# Patient Record
Sex: Male | Born: 1965 | Race: White | Hispanic: No | Marital: Single | Smoking: Current every day smoker
Health system: Southern US, Community
[De-identification: ages and names within clinical notes are randomized; demographics above are authoritative.]

---

## 2019-08-01 ENCOUNTER — Inpatient Hospital Stay
Admission: AD | Admit: 2019-08-01 | Discharge: 2019-08-08 | DRG: 885 | Disposition: A | Discharge status: Home / Self Care | Payer: PRIVATE HEALTH INSURANCE | Source: Other Acute Inpatient Hospital | Attending: Psychiatry | Admitting: Psychiatry

## 2019-08-01 DIAGNOSIS — F314 Bipolar disorder, current episode depressed, severe, without psychotic features: Secondary | ICD-10-CM

## 2019-08-01 DIAGNOSIS — U071 COVID-19: Secondary | ICD-10-CM | POA: Diagnosis present

## 2019-08-01 DIAGNOSIS — F3163 Bipolar disorder, current episode mixed, severe, without psychotic features: Principal | ICD-10-CM | POA: Diagnosis present

## 2019-08-01 DIAGNOSIS — F419 Anxiety disorder, unspecified: Secondary | ICD-10-CM | POA: Diagnosis present

## 2019-08-01 DIAGNOSIS — R45851 Suicidal ideations: Secondary | ICD-10-CM | POA: Diagnosis present

## 2019-08-01 DIAGNOSIS — R45850 Homicidal ideations: Secondary | ICD-10-CM | POA: Diagnosis present

## 2019-08-01 DIAGNOSIS — R0789 Other chest pain: Secondary | ICD-10-CM | POA: Diagnosis present

## 2019-08-01 DIAGNOSIS — G4700 Insomnia, unspecified: Secondary | ICD-10-CM | POA: Diagnosis present

## 2019-08-01 DIAGNOSIS — R079 Chest pain, unspecified: Secondary | ICD-10-CM

## 2019-08-01 DIAGNOSIS — F79 Unspecified intellectual disabilities: Secondary | ICD-10-CM | POA: Diagnosis present

## 2019-08-01 DIAGNOSIS — F17210 Nicotine dependence, cigarettes, uncomplicated: Secondary | ICD-10-CM | POA: Diagnosis present

## 2019-08-01 DIAGNOSIS — F329 Major depressive disorder, single episode, unspecified: Secondary | ICD-10-CM | POA: Insufficient documentation

## 2019-08-01 MED ORDER — DIVALPROEX SODIUM 500 MG PO DR TAB
500.0000 mg | DELAYED_RELEASE_TABLET | Freq: Two times a day (BID) | ORAL | Status: DC
  Administered 2019-08-03 – 2019-08-06 (×5): 500 mg via ORAL
  Filled 2019-08-01 (×7): qty 1

## 2019-08-01 MED ORDER — ACETAMINOPHEN 325 MG PO TABS: 650.0000 mg | ORAL_TABLET | Freq: Four times a day (QID) | ORAL | Status: DC | PRN

## 2019-08-01 MED ORDER — MAGNESIUM HYDROXIDE 400 MG/5ML PO SUSP: 30.0000 mL | Freq: Every day | ORAL | Status: DC | PRN

## 2019-08-01 MED ORDER — ZOLPIDEM TARTRATE 5 MG PO TABS: 5.0000 mg | ORAL_TABLET | Freq: Every evening | ORAL | Status: DC | PRN

## 2019-08-01 MED ORDER — NICOTINE 21 MG/24HR TD PT24
21.0000 mg | MEDICATED_PATCH | Freq: Every day | TRANSDERMAL | Status: DC
  Administered 2019-08-05 – 2019-08-08 (×2): 21 mg via TRANSDERMAL
  Filled 2019-08-01 (×5): qty 1

## 2019-08-01 MED ORDER — ALUM & MAG HYDROXIDE-SIMETH 200-200-20 MG/5ML PO SUSP: 30.0000 mL | ORAL | Status: DC | PRN

## 2019-08-01 MED ORDER — QUETIAPINE FUMARATE 200 MG PO TABS
200.0000 mg | ORAL_TABLET | Freq: Every day | ORAL | Status: DC
  Administered 2019-08-02: 200 mg via ORAL
  Filled 2019-08-01: qty 1

## 2019-08-01 MED ORDER — DOXEPIN HCL 25 MG PO CAPS
25.0000 mg | ORAL_CAPSULE | Freq: Every evening | ORAL | Status: DC | PRN
  Filled 2019-08-01: qty 1
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
## 2019-08-02 ENCOUNTER — Other Ambulatory Visit: Payer: Self-pay

## 2019-08-02 MED ORDER — LORAZEPAM 2 MG/ML IJ SOLN
2.0000 mg | INTRAMUSCULAR | Status: DC | PRN
  Administered 2019-08-04 – 2019-08-07 (×3): 2 mg via INTRAMUSCULAR
  Filled 2019-08-02 (×6): qty 1

## 2019-08-02 MED ORDER — ZIPRASIDONE MESYLATE 20 MG IM SOLR
20.0000 mg | Freq: Four times a day (QID) | INTRAMUSCULAR | Status: DC | PRN
  Administered 2019-08-02 – 2019-08-07 (×8): 20 mg via INTRAMUSCULAR
  Filled 2019-08-02 (×9): qty 20

## 2019-08-02 MED ORDER — LORAZEPAM 2 MG PO TABS
2.0000 mg | ORAL_TABLET | ORAL | Status: DC | PRN
  Administered 2019-07-31 – 2019-08-06 (×5): 2 mg via ORAL
  Filled 2019-08-02 (×5): qty 1

## 2019-08-02 NOTE — Plan of Care (Signed)
  Problem: Self-Concept: Goal: Will verbalize positive feelings about self Outcome: Progressing  Patient verbalized positive feelings to staff.

## 2019-08-02 NOTE — Progress Notes (Signed)
Patient ID: Brendan Reyes, male   DOB: 11-23-1965, 53 y.o.   MRN: [id number] Patient seen and chart reviewed.  I will defer H&P in the hopes that I get a better evaluation a little later today.  Twice I have attempted to have a conversation with this gentleman and on both occasions he has stormed out of the room and told me he would refuse to answer any questions.  He seemed to have some difficulty understanding when I explained that he was under involuntary commitment papers and could not simply signed himself out of the hospital.  Nursing concerned about possible need for sedating medication and orders have been placed for as needed Ativan and Geodon.

## 2019-08-02 NOTE — Progress Notes (Signed)
Pt gets easily angered. He slammed down the phone  and punched the windows again. I confronted him and informed him that I  could make a long distance phone call if he needed. He told me to leave him alone. Collier Bullock RN

## 2019-08-02 NOTE — B[facility] Group Notes (Signed)
LCSW Group Therapy Note  08/02/2019 1:00 PM  Type of Therapy/Topic:  Group Therapy:  Emotion Regulation  Participation Level:  Did Not Attend   Description of Group:   The purpose of this group is to assist patients in learning to regulate negative emotions and experience positive emotions. Patients will be guided to discuss ways in which they have been vulnerable to their negative emotions. These vulnerabilities will be juxtaposed with experiences of positive emotions or situations, and patients will be challenged to use positive emotions to combat negative ones. Special emphasis will be placed on coping with negative emotions in conflict situations, and patients will process healthy conflict resolution skills.  Therapeutic Goals: 1. Patient will identify two positive emotions or experiences to reflect on in order to balance out negative emotions 2. Patient will label two or more emotions that they find the most difficult to experience 3. Patient will demonstrate positive conflict resolution skills through discussion and/or role plays  Summary of Patient Progress: X  Therapeutic Modalities:   Cognitive Behavioral Therapy Feelings Identification Dialectical Behavioral Therapy  Brendan Reyes, MSW, LCSW 08/02/2019 11:42 AM 

## 2019-08-02 NOTE — Progress Notes (Signed)
Admission Note:  53 yr male who presents IVC in no acute distress for the treatment of SI and Depression. Patient appears angry he repeatedly stated " l don't want to be here" he was argumentative ad restless initially but after some emotional support he was calm and cooperative with admission process. Ptatient presents with passive SI and contracts for safety upon admission. Patient explained he is angry because his 54 yr old fiancee is cheating on him with his 26 year old nephew.  Patient has Past medical Hx of  Depression, Bipolar disorder and  Anxiety, skin was assessed and found to be clear of any abnormal marks, he was also searched and no contraband found, Plan of care, unit policies, was explained and understanding verbalized. Consents obtained. Food and fluid  was offered and it was accepted, 15 minutes safety checks maintained will continue to monitor closely.

## 2019-08-02 NOTE — Plan of Care (Signed)
Patient newly admitted, hasn't had time to progress.    Problem: Education: Goal: Knowledge of [facility] Education information/materials will improve Outcome: Not Progressing Goal: Emotional status will improve Outcome: Not Progressing Goal: Mental status will improve Outcome: Not Progressing Goal: Verbalization of understanding the information provided will improve Outcome: Not Progressing   Problem: Safety: Goal: Periods of time without injury will increase Outcome: Not Progressing   Problem: Education: Goal: Ability to make informed decisions regarding treatment will improve Outcome: Not Progressing   Problem: Self-Concept: Goal: Ability to disclose and discuss suicidal ideas will improve Outcome: Not Progressing Goal: Will verbalize positive feelings about self Outcome: Not Progressing   

## 2019-08-02 NOTE — Progress Notes (Signed)
CSW attempted to complete PSA for pt, but was unable to due to pt being aggressive. Pt was slamming the phone on and off the receiver, yelling, punching the window, and beating against his room door. Per staff pt was going to get medication to assist with calming him down.   CSW will try another time to complete PSA.   Evalina Field, MSW, LCSW Clinical Social Work 08/02/2019 9:34 AM

## 2019-08-02 NOTE — Progress Notes (Signed)
Pt was extremely agitated, angry and verbally aggressive. He made threats that he was going to punch the windows out. He was given an injection and willingly took it.Collier Bullock RN

## 2019-08-02 NOTE — Progress Notes (Signed)
Pt was making verbal threats. He was loud, making threats to elope. He put socks on both of his hands and punching the window in his room.   He was cooperative when his PRN was given. Collier Bullock RN

## 2019-08-02 NOTE — Tx Team (Signed)
Initial Treatment Plan 08/02/2019 5:31 AM Raliegh Ip NGE:[id number]    PATIENT STRESSORS: Health problems Marital or family conflict   PATIENT STRENGTHS: Capable of independent living Motivation for treatment/growth Religious Affiliation   PATIENT IDENTIFIED PROBLEMS: Suicidal ideation     Depression                  DISCHARGE CRITERIA:  Improved stabilization in mood, thinking, and/or behavior Motivation to continue treatment in a less acute level of care  PRELIMINARY DISCHARGE PLAN: Outpatient therapy  PATIENT/FAMILY INVOLVEMENT: This treatment plan has been presented to and reviewed with the patient, Brendan Reyes,  The patient and family have been given the opportunity to ask questions and make suggestions.  Harl Bowie, RN 08/02/2019, 5:31 AM

## 2019-08-02 NOTE — Progress Notes (Signed)
Recreation Therapy Notes  Date: 08/02/2019  Time: 9:30 am   Location: Craft room   Behavioral response: N/A   Intervention Topic: Stress  Discussion/Intervention: Patient did not attend group.   Clinical Observations/Feedback:  Patient did not attend group.   Deziyah Arvin LRT/CTRS        Dannon Nguyenthi 08/02/2019 11:21 AM

## 2019-08-03 DIAGNOSIS — F314 Bipolar disorder, current episode depressed, severe, without psychotic features: Secondary | ICD-10-CM

## 2019-08-03 MED ORDER — LITHIUM CARBONATE ER 300 MG PO TBCR
300.0000 mg | EXTENDED_RELEASE_TABLET | Freq: Two times a day (BID) | ORAL | Status: DC
  Administered 2019-08-03 – 2019-08-06 (×5): 300 mg via ORAL
  Filled 2019-08-03 (×5): qty 1

## 2019-08-03 NOTE — Plan of Care (Signed)
Patient acted out on day shift and was given medications. Patient remains asleep and safe on the unit  Problem: Education: Goal: Knowledge of [facility] Education information/materials will improve Outcome: Not Progressing Goal: Emotional status will improve Outcome: Not Progressing Goal: Mental status will improve Outcome: Not Progressing Goal: Verbalization of understanding the information provided will improve Outcome: Not Progressing

## 2019-08-03 NOTE — Plan of Care (Signed)
  Problem: Education: Goal: Knowledge of [facility] Education information/materials will improve Outcome: Not Progressing Goal: Emotional status will improve Outcome: Not Progressing Goal: Mental status will improve Outcome: Not Progressing Goal: Verbalization of understanding the information provided will improve Outcome: Not Progressing  D: Patient has slept most of the shift. Refusing all medication. Not voicing any SI, HI or AVH.  A: Continue to monitor for safety R: Safety maintained.
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
## 2019-08-03 NOTE — Progress Notes (Signed)
D: Patient has slept most of the shift. Refusing all medication. Not voicing any SI, HI or AVH.  A: Continue to monitor for safety R: Safety maintained.
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
## 2019-08-03 NOTE — Progress Notes (Signed)
Pt refused his meds at first. I asked him again and he said that he would take his Depakote because "I'm hurting so bad". I asked him if he was referring to the emotional pain and he said "yes". I asked him if he was SI and he said "yes". I asked him if the had a plan and he said "Yes I want to so bad that I'm going to find any way... break a window... to get out of here and do it". He rates depression and anxiety 10/10. He denies AVH and HI. I made all the staff aware on unit.  Collier Bullock RN

## 2019-08-03 NOTE — BHH Counselor (Addendum)
CSW met with patient to complete the PSA.  Patient identified his problem as "I have no intention on living".  Pt identified that his goal is to "how to figure out how to die.  I pray not eating and drinking will do it".  Patient then began to make statements on how he heard that not eating/drinking was a painless way of dying.  CSW asked patient how would he know that he is safe and ready for discharge and patient stated "when I am dead".   Patients affect was flat and pt appeared to be depressed. Pt avoided eye contact.  He made statements that he was beyond help.  Patient declined aftercare and SPE contact, again stating that he had no intention of living or leaving the unit alive. Pt asked CSW to pray for him and thanked her for her kindness and time, as if saying goodbye.  CSW made nurse aware of the patients comments, including his plan to not leave the unit alive, starve himself while here in an effort to commit suicide, active suicidal ideations and statements that he has no intention on living.   Assunta Curtis, MSW, LCSW 08/03/2019 1:52 PM

## 2019-08-03 NOTE — Progress Notes (Signed)
Recreation Therapy Notes  INPATIENT RECREATION THERAPY ASSESSMENT  Patient Details Name: Brendan Reyes MRN: [id number] DOB: July 28, 1966 Today's Date: 08/03/2019       Information Obtained From: Patient  Able to Participate in Assessment/Interview: Yes  Patient Presentation: Responsive  Reason for Admission (Per Patient): Active Symptoms, Suicidal Ideation  Patient Stressors:    Coping Skills:      Leisure Interests (2+):  Nature - Microbiologist of Recreation/Participation:    Futures trader Resources:     [employer]:     Current Use:    If no, Barriers?:    Expressed Interest in [employer] Information:    [employer] of Residence:  Lobbyist  Patient Main Form of Transportation:    Patient Strengths:  N/A  Patient Identified Areas of Improvement:  N/A  Patient Goal for Hospitalization:  N/A  Current SI (including self-harm):     Current HI:     Current AVH:    Staff Intervention Plan: Group Attendance, Collaborate with Interdisciplinary Treatment Team  Consent to Intern Participation: N/A  Brendan Reyes 08/03/2019, 3:45 PM

## 2019-08-03 NOTE — Progress Notes (Signed)
Recreation Therapy Notes   Date: 08/03/2019  Time: 9:30 am   Location: Craft room   Behavioral response: N/A   Intervention Topic: Leisure  Discussion/Intervention: Patient did not attend group.   Clinical Observations/Feedback:  Patient did not attend group.   Jakwan Sally LRT/CTRS        Nery Frappier 08/03/2019 11:10 AM

## 2019-08-03 NOTE — Tx Team (Signed)
Interdisciplinary Treatment and Diagnostic Plan Update  08/03/2019 Time of Session: 900am Brendan Reyes MRN: [id number]  Principal Diagnosis: <principal problem not specified>  Secondary Diagnoses: Active Problems:   MDD (major depressive disorder)   Current Medications:  Current Facility-Administered Medications  Medication Dose Route Frequency Provider Last Rate Last Admin  . acetaminophen (TYLENOL) tablet 650 mg  650 mg Oral Q6H PRN Cristofano, Dorene Ar, MD      . alum & mag hydroxide-simeth (MAALOX/MYLANTA) 200-200-20 MG/5ML suspension 30 mL  30 mL Oral Q4H PRN Cristofano, Paul A, MD      . divalproex (DEPAKOTE) DR tablet 500 mg  500 mg Oral Q12H Cristofano, Dorene Ar, MD   500 mg at 08/03/19 0905  . doxepin (SINEQUAN) capsule 25 mg  25 mg Oral QHS PRN Cristofano, Paul A, MD      . LORazepam (ATIVAN) tablet 2 mg  2 mg Oral Q4H PRN Clapacs, Madie Reno, MD       Or  . LORazepam (ATIVAN) injection 2 mg  2 mg Intramuscular Q4H PRN Clapacs, John T, MD      . magnesium hydroxide (MILK OF MAGNESIA) suspension 30 mL  30 mL Oral Daily PRN Cristofano, Paul A, MD      . nicotine (NICODERM CQ - dosed in mg/24 hours) patch 21 mg  21 mg Transdermal Daily Cristofano, Paul A, MD      . QUEtiapine (SEROQUEL) tablet 200 mg  200 mg Oral QHS Cristofano, Paul A, MD   200 mg at 08/02/19 2153  . ziprasidone (GEODON) injection 20 mg  20 mg Intramuscular Q6H PRN Clapacs, John T, MD   20 mg at 08/02/19 1722  . zolpidem (AMBIEN) tablet 5 mg  5 mg Oral QHS PRN Cristofano, Dorene Ar, MD       PTA Medications: No medications prior to admission.    Patient Stressors: Health problems Marital or family conflict  Patient Strengths: Capable of independent living Motivation for treatment/growth Religious Affiliation  Treatment Modalities: Medication Management, Group therapy, Case management,  1 to 1 session with clinician, Psychoeducation, Recreational therapy.   Physician Treatment Plan for Primary Diagnosis:  <principal problem not specified> Long Term Goal(s):     Short Term Goals:    Medication Management: Evaluate patient's response, side effects, and tolerance of medication regimen.  Therapeutic Interventions: 1 to 1 sessions, Unit Group sessions and Medication administration.  Evaluation of Outcomes: Not Met  Physician Treatment Plan for Secondary Diagnosis: Active Problems:   MDD (major depressive disorder)  Long Term Goal(s):     Short Term Goals:       Medication Management: Evaluate patient's response, side effects, and tolerance of medication regimen.  Therapeutic Interventions: 1 to 1 sessions, Unit Group sessions and Medication administration.  Evaluation of Outcomes: Not Met   RN Treatment Plan for Primary Diagnosis: <principal problem not specified> Long Term Goal(s): Knowledge of disease and therapeutic regimen to maintain health will improve  Short Term Goals: Ability to remain free from injury will improve, Ability to verbalize frustration and anger appropriately will improve, Ability to demonstrate self-control, Ability to participate in decision making will improve, Ability to verbalize feelings will improve, Ability to disclose and discuss suicidal ideas, Ability to identify and develop effective coping behaviors will improve and Compliance with prescribed medications will improve  Medication Management: RN will administer medications as ordered by provider, will assess and evaluate patient's response and provide education to patient for prescribed medication. RN will report any adverse and/or side effects  to prescribing provider.  Therapeutic Interventions: 1 on 1 counseling sessions, Psychoeducation, Medication administration, Evaluate responses to treatment, Monitor vital signs and CBGs as ordered, Perform/monitor CIWA, COWS, AIMS and Fall Risk screenings as ordered, Perform wound care treatments as ordered.  Evaluation of Outcomes: Not Met   LCSW Treatment Plan  for Primary Diagnosis: <principal problem not specified> Long Term Goal(s): Safe transition to appropriate next level of care at discharge, Engage patient in therapeutic group addressing interpersonal concerns.  Short Term Goals: Engage patient in aftercare planning with referrals and resources, Increase ability to appropriately verbalize feelings, Increase emotional regulation and Increase skills for wellness and recovery  Therapeutic Interventions: Assess for all discharge needs, 1 to 1 time with Social worker, Explore available resources and support systems, Assess for adequacy in community support network, Educate family and significant other(s) on suicide prevention, Complete Psychosocial Assessment, Interpersonal group therapy.  Evaluation of Outcomes: Not Met   Progress in Treatment: Attending groups: No. Participating in groups: No. Taking medication as prescribed: No. Toleration medication: Yes. Family/Significant other contact made: No, will contact:  when consent is given Patient understands diagnosis: No. Discussing patient identified problems/goals with staff: No. Medical problems stabilized or resolved: Yes. Denies suicidal/homicidal ideation: No. Pt endorses SI with plan Issues/concerns per patient self-inventory: No. Other: N/A  New problem(s) identified: No, Describe:  none  New Short Term/Long Term Goal(s): Detox, elimination of AVH/symptoms of psychosis, medication management for mood stabilization; elimination of SI thoughts; development of comprehensive mental wellness/sobriety plan.   Patient Goals:  Pt invited to treatment team and refused to attend, no goal obtained.  Discharge Plan or Barriers: SPE pamphlet, Mobile Crisis information, and AA/NA information provided to patient for additional community support and resources. CSW assessing for appropriate referrals.   Reason for Continuation of Hospitalization: Aggression Medication stabilization Suicidal  ideation  Estimated Length of Stay: 5-7 days  Attendees: Patient: Brendan Reyes 08/03/2019 10:14 AM  Physician: Dr Weber Cooks MD 08/03/2019 10:14 AM  Nursing: Polly Cobia RN 08/03/2019 10:14 AM  RN Care Manager: 08/03/2019 10:14 AM  Social Worker: Minette Brine Nasreen Goedecke LCSW 08/03/2019 10:14 AM  Recreational Therapist: Roanna Epley CTRS LRT 08/03/2019 10:14 AM  Other: Assunta Curtis LCSW  08/03/2019 10:14 AM  Other:  08/03/2019 10:14 AM  Other: 08/03/2019 10:14 AM    Scribe for Treatment Team: Mariann Laster Ginnie Marich, LCSW 08/03/2019 10:14 AM

## 2019-08-03 NOTE — Progress Notes (Signed)
Patient was asleep upon arrival to the unit. Patient was given medication prior to shift and has remained asleep on the unit. Patient being monitored Q 15 minutes for safety per unit protocol. Patient remains safe on the unit.

## 2019-08-03 NOTE — [facility] Suicide Risk Assessment (Signed)
[facility] Admission Suicide Risk Assessment   Nursing information obtained from:  Patient Demographic factors:  Male, Caucasian, Low socioeconomic status Current Mental Status:  Suicidal ideation indicated by patient Loss Factors:  Financial problems / change in socioeconomic status Historical Factors:  NA Risk Reduction Factors:  NA  Total Time spent with patient: 1 hour Principal Problem: Severe bipolar I disorder, current or most recent episode depressed (HCC) Diagnosis:  Principal Problem:   Severe bipolar I disorder, current or most recent episode depressed (HCC)  Subjective Data: Patient seen and chart reviewed.  This is a 53 year old man who was transferred to [place] from [facility].  Patient has been here for over a day.  I attempted to evaluate him yesterday but he refused to participate in even the most basic evaluation.  Today the patient was able to answer a few questions although the evaluation is still incomplete.  He tells me today that if he had a gun or a knife he would absolutely kill himself right now.  He is clearly overwhelmed with emotion and feels angry sad and upset.  Some of the things he is talking about seem a little odd is not clear if any of that is delusional none of that is obviously psychotic.  Patient is not intoxicated does not appear to be having withdrawal and does not complain of specific medical problems  Continued Clinical Symptoms:  Alcohol Use Disorder Identification Test Final Score (AUDIT): 0 The "Alcohol Use Disorders Identification Test", Guidelines for Use in Primary Care, Second Edition.  World Science writer [facility]). Score between 0-7:  no or low risk or alcohol related problems. Score between 8-15:  moderate risk of alcohol related problems. Score between 16-19:  high risk of alcohol related problems. Score 20 or above:  warrants further diagnostic evaluation for alcohol dependence and treatment.   CLINICAL FACTORS:   Severe Anxiety and/or  Agitation Bipolar Disorder:   Mixed State Depression:   Anhedonia Hopelessness Impulsivity   Musculoskeletal: Strength & Muscle Tone: within normal limits Gait & Station: normal Patient leans: N/A  Psychiatric Specialty Exam: Physical Exam  Nursing note and vitals reviewed. Constitutional: He appears well-developed and well-nourished.  HENT:  Head: Normocephalic and atraumatic.  Eyes: Pupils are equal, round, and reactive to light. Conjunctivae are normal.  Cardiovascular: Regular rhythm and normal heart sounds.  Respiratory: Effort normal.  GI: Soft.  Musculoskeletal:        General: Normal range of motion.     Cervical back: Normal range of motion.  Neurological: He is alert.  Skin: Skin is warm and dry.  Psychiatric: His mood appears anxious. His affect is angry and labile. His speech is tangential and slurred. He is agitated. He is not aggressive. Thought content is paranoid. Cognition and memory are impaired. He expresses impulsivity and inappropriate judgment. He exhibits a depressed mood. He expresses suicidal ideation. He expresses suicidal plans.    Review of Systems  Constitutional: Negative.   HENT: Negative.   Eyes: Negative.   Respiratory: Negative.   Cardiovascular: Negative.   Gastrointestinal: Negative.   Musculoskeletal: Negative.   Skin: Negative.   Neurological: Negative.   Psychiatric/Behavioral: Positive for agitation, behavioral problems and suicidal ideas. The patient is nervous/anxious.     Blood pressure 123/88, pulse 84, temperature 98 F (36.7 C), temperature source Oral, resp. rate 16, height 6\' 1"  (1.854 m), weight 98 kg, SpO2 100 %.Body mass index is 28.5 kg/m.  General Appearance: Casual  Eye Contact:  Minimal  Speech:  Garbled, Pressured and Slurred  Volume:  Decreased  Mood:  Angry, Anxious and Depressed  Affect:  Congruent  Thought Process:  Disorganized  Orientation:  Negative  Thought Content:  Rumination and Tangential   Suicidal Thoughts:  Yes.  with intent/plan  Homicidal Thoughts:  No  Memory:  Immediate;   Fair Recent;   Poor Remote;   Poor  Judgement:  Impaired  Insight:  Shallow  Psychomotor Activity:  Restlessness  Concentration:  Concentration: Poor  Recall:  [place] of Knowledge:  Fair  Language:  Fair  Akathisia:  No  Handed:  Right  AIMS (if indicated):     Assets:  Desire for Improvement Physical Health  ADL's:  Impaired  Cognition:  Impaired,  Mild  Sleep:  Number of Hours: 8.25      COGNITIVE FEATURES THAT CONTRIBUTE TO RISK:  Loss of executive function, Polarized thinking and Thought constriction (tunnel vision)    SUICIDE RISK:   Severe:  Frequent, intense, and enduring suicidal ideation, specific plan, no subjective intent, but some objective markers of intent (i.e., choice of lethal method), the method is accessible, some limited preparatory behavior, evidence of impaired self-control, severe dysphoria/symptomatology, multiple risk factors present, and few if any protective factors, particularly a lack of social support.  PLAN OF CARE: Patient will continue on 15-minute checks.  He is being observed by nursing staff.  We will keep a close eye on whether he has any behaviors to suggest acting out on suicidality.  Patient was not fully cooperative with forming a treatment plan.  I am going to add lithium to his medicine because of the intensity of his suicidal statements.  Patient will be engaged as much as possible in individual and group therapy.  We can reassess need for closer observation in an ongoing way.  Obviously reassessment prior to any discharge planning.  I certify that inpatient services furnished can reasonably be expected to improve the patient's condition.   Alethia Berthold, MD 08/03/2019, 11:16 AM

## 2019-08-03 NOTE — Progress Notes (Signed)
Patient safety check was done in patient's room.and was found to be unsafe. Collier Bullock RN

## 2019-08-03 NOTE — BHH Group Notes (Signed)
[place] Group Notes:  (Nursing/MHT/Case Management/Adjunct)  Date:  08/03/2019  Time:  8:31 PM  Type of Therapy:  Group Therapy  Participation Level:  Did Not Attend    Summary of Progress/Problems:  Brendan Reyes 08/03/2019, 8:31 PM

## 2019-08-03 NOTE — Plan of Care (Signed)
Pt was educated on care plan and verbalizes understanding. Collier Bullock RN Problem: Education: Goal: Freight forwarder Education information/materials will improve Outcome: Not Progressing Goal: Emotional status will improve Outcome: Not Progressing Goal: Mental status will improve Outcome: Not Progressing Goal: Verbalization of understanding the information provided will improve Outcome: Not Progressing   Problem: Safety: Goal: Periods of time without injury will increase Outcome: Not Progressing   Problem: Education: Goal: Ability to make informed decisions regarding treatment will improve Outcome: Not Progressing   Problem: Self-Concept: Goal: Ability to disclose and discuss suicidal ideas will improve Outcome: Not Progressing Goal: Will verbalize positive feelings about self Outcome: Not Progressing

## 2019-08-03 NOTE — Progress Notes (Signed)
Pt has been verbally agressive, making threats, agitation, anxiety and physically punching the windows and walls . Pt has made threats throughout the day that he was going to harm himself and escape from the unit.Will continue to monitor to keep safe. Staff is aware.  Collier Bullock RN

## 2019-08-03 NOTE — BHH Group Notes (Signed)
LCSW Group Therapy Note  08/03/2019 2:02 PM  Type of Therapy/Topic:  Group Therapy:  Balance in Life  Participation Level:  Did Not Attend  Description of Group:    This group will address the concept of balance and how it feels and looks when one is unbalanced. Patients will be encouraged to process areas in their lives that are out of balance and identify reasons for remaining unbalanced. Facilitators will guide patients in utilizing problem-solving interventions to address and correct the stressor making their life unbalanced. Understanding and applying boundaries will be explored and addressed for obtaining and maintaining a balanced life. Patients will be encouraged to explore ways to assertively make their unbalanced needs known to significant others in their lives, using other group members and facilitator for support and feedback.  Therapeutic Goals: 1. Patient will identify two or more emotions or situations they have that consume much of in their lives. 2. Patient will identify signs/triggers that life has become out of balance:  3. Patient will identify two ways to set boundaries in order to achieve balance in their lives:  4. Patient will demonstrate ability to communicate their needs through discussion and/or role plays  Summary of Patient Progress: x     Therapeutic Modalities:   Cognitive Behavioral Therapy Solution-Focused Therapy Assertiveness Training  Evalina Field, MSW, LCSW Clinical Social Work 08/03/2019 2:02 PM

## 2019-08-03 NOTE — BHH Suicide Risk Assessment (Signed)
[place] INPATIENT:  Family/Significant Other Suicide Prevention Education  Suicide Prevention Education:  Patient Refusal for Family/Significant Other Suicide Prevention Education: The patient Brendan Reyes has refused to provide written consent for family/significant other to be provided Family/Significant Other Suicide Prevention Education during admission and/or prior to discharge.  Physician notified.  SPE completed with pt, as pt refused to consent to family contact. SPI pamphlet provided to pt and pt was encouraged to share information with support network, ask questions, and talk about any concerns relating to SPE. Pt denies access to guns/firearms and verbalized understanding of information provided. Mobile Crisis information also provided to pt.   Rozann Lesches 08/03/2019, 2:25 PM

## 2019-08-04 MED ORDER — QUETIAPINE FUMARATE 25 MG PO TABS
50.0000 mg | ORAL_TABLET | Freq: Two times a day (BID) | ORAL | Status: DC
  Administered 2019-08-05 – 2019-08-08 (×4): 50 mg via ORAL
  Filled 2019-08-04 (×5): qty 2

## 2019-08-04 MED ORDER — HYDROXYZINE HCL 50 MG PO TABS
50.0000 mg | ORAL_TABLET | Freq: Three times a day (TID) | ORAL | Status: DC | PRN
  Administered 2019-08-06: 50 mg via ORAL
  Filled 2019-08-04: qty 1

## 2019-08-04 NOTE — Progress Notes (Signed)
Medications held for this patient last night because the patient was asleep from previous medication administration per MD orders. Pt awake and alert at this time. Patient without complaint. Patient remains safe on the unit.

## 2019-08-04 NOTE — Progress Notes (Signed)
Recreation Therapy Notes   Date: 08/04/2019  Time: 9:30 am   Location: Craft room   Behavioral response: N/A   Intervention Topic: Relaxation   Discussion/Intervention: Patient did not attend group.   Clinical Observations/Feedback:  Patient did not attend group.   Elani Delph LRT/CTRS        Cruze Zingaro 08/04/2019 10:47 AM

## 2019-08-04 NOTE — Progress Notes (Signed)
Patient slept for about 2 hours then woke up with anxiety, agitations and aggressive behaviors. Kicking and throwing things, yelling, cursing. Refusing medications and meals " I will not eat until I die.Brendan KitchenMarland KitchenI have no reason to live". Patient becomes erratic over little things and is difficult to redirect. Guarded and not allowing staff to talk to him. Currently in his room. Safety precautions reinforced.

## 2019-08-04 NOTE — [facility] Group Notes (Signed)
LCSW Group Therapy Note  08/04/2019 12:50 PM  Type of Therapy and Topic:  Group Therapy:  Feelings around Relapse and Recovery  Participation Level:  Did Not Attend   Description of Group:    Patients in this group will discuss emotions they experience before and after a relapse. They will process how experiencing these feelings, or avoidance of experiencing them, relates to having a relapse. Facilitator will guide patients to explore emotions they have related to recovery. Patients will be encouraged to process which emotions are more powerful. They will be guided to discuss the emotional reaction significant others in their lives may have to their relapse or recovery. Patients will be assisted in exploring ways to respond to the emotions of others without this contributing to a relapse.  Therapeutic Goals: 1. Patient will identify two or more emotions that lead to a relapse for them 2. Patient will identify two emotions that result when they relapse 3. Patient will identify two emotions related to recovery 4. Patient will demonstrate ability to communicate their needs through discussion and/or role plays   Summary of Patient Progress: X  Therapeutic Modalities:   Cognitive Behavioral Therapy Solution-Focused Therapy Assertiveness Training Relapse Prevention Therapy   Brendan Reyes, MSW, LCSW 08/04/2019 12:50 PM 
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
## 2019-08-04 NOTE — Progress Notes (Signed)
Pt still pacing and yelling in the hallway. Pt agitated. Threw all of his snacks out of the room. Placed bedside commode outside the room as well. Pt still cursing at staff.

## 2019-08-04 NOTE — Progress Notes (Signed)
Pt asleep in his room.

## 2019-08-04 NOTE — Progress Notes (Signed)
Pt refused his morning vital sign check.

## 2019-08-04 NOTE — Plan of Care (Signed)
Patient was in bed upon the beginning of this shift. Was encouraged to go to the dayroom for breakfast. Became angry and agitated when he was told not to take food to his room then decided not to eat. Became more and more restless and irritated, reporting that he wants to kill himself. "That dem doctor is not even talking to [place], he is only talking to you all, not [place] patients". Patient paced in the hallway talking about killing himself. Received Ativan 2mg  by mouth. MD was notified. Emotional support provided. Safety precaution reinforced.

## 2019-08-04 NOTE — Progress Notes (Addendum)
Pt woke up agitated, wanting to make a phone call. Pt c/o not getting his phone call from his friend Arbie Cookey [phone]) who he states called him back earlier today while he was asleep. Pt was banging on the window and raising his voice at staff. Writer and other pt's RN decided to give patient medication for agitation - Ativan 2 mg IM and Geodon 20 mg IM. Before we could administer injections, pt became combative, belligerent and making aggressive moves against staff member. Security was there to handle de-escalation along with this Probation officer and Catalina Antigua, Therapist, sports. Pt was yelling but agreed to take injections. Pt given one IM injection in each deltoid muscle without incident.

## 2019-08-05 LAB — LITHIUM LEVEL: Lithium Lvl: <0.06 mmol/L — ABNORMAL LOW (ref 0.60–1.20)

## 2019-08-05 MED ORDER — HALOPERIDOL LACTATE 5 MG/ML IJ SOLN
5.0000 mg | Freq: Three times a day (TID) | INTRAMUSCULAR | Status: DC | PRN
  Administered 2019-08-05: 5 mg via INTRAMUSCULAR
  Filled 2019-08-05 (×2): qty 1

## 2019-08-05 MED ORDER — QUETIAPINE FUMARATE 200 MG PO TABS: 300.0000 mg | ORAL_TABLET | Freq: Every day | ORAL | Status: DC

## 2019-08-05 MED ORDER — QUETIAPINE FUMARATE 200 MG PO TABS
400.0000 mg | ORAL_TABLET | Freq: Every day | ORAL | Status: DC
  Administered 2019-08-05: 21:00:00 400 mg via ORAL
  Filled 2019-08-05: qty 2

## 2019-08-05 MED ORDER — HALOPERIDOL 5 MG PO TABS: 5.0000 mg | ORAL_TABLET | ORAL | Status: DC

## 2019-08-05 MED ORDER — HALOPERIDOL 5 MG PO TABS: 5.0000 mg | ORAL_TABLET | Freq: Three times a day (TID) | ORAL | Status: DC | PRN

## 2019-08-05 NOTE — Plan of Care (Signed)
Patient unable to process information received  Problem: Education: Goal: Knowledge of [facility] Education information/materials will improve Outcome: Progressing Goal: Emotional status will improve Outcome: Not Progressing Goal: Mental status will improve Outcome: Not Progressing Goal: Verbalization of understanding the information provided will improve Outcome: Not Progressing   Problem: Safety: Goal: Periods of time without injury will increase Outcome: Not Progressing   Problem: Education: Goal: Ability to make informed decisions regarding treatment will improve Outcome: Not Progressing   Problem: Education: Goal: Ability to make informed decisions regarding treatment will improve Outcome: Not Progressing

## 2019-08-05 NOTE — Progress Notes (Signed)
4 -Patient remained  Sleep  In room 1:1 present  5Patient remained  Sleep  In room 1:1 present   6 Patient remained  Sleep  In room 1:1 present  7Patient remained  Sleep  In room 1:1 present
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
## 2019-08-05 NOTE — Progress Notes (Signed)
D: Bipolar   A: Requested  To call relative this am in [place] at 7:20 AM voice mail left. Patient commented on why he didn't get his medication last night . Stated he was witting it in his papers. Requested patient to come to medication room for his am medication . Stated to Probation officer he wasn't taking anything . Writer approached patient again , patient again stated he wasn't taking no medication  And he wasn't going to eat anything else while he is here. Patient paced up hall to his room .   Patient approached  Nursing station  Once again stating that he was not  Taking any medication and not going to eat  And " What every happens , happens" R:  Patient   Monitored every 15 minutes

## 2019-08-05 NOTE — Plan of Care (Signed)
Pt verbally and physically aggressive. Pt admitted that he felt hopeless and frustrated with no social support and feels isolated here on the unit. Still doesn't express feelings appropriately.   Problem: Self-Concept: Goal: Ability to disclose and discuss suicidal ideas will improve Outcome: Progressing   Problem: Education: Goal: Emotional status will improve Outcome: Not Progressing Goal: Mental status will improve Outcome: Not Progressing Goal: Verbalization of understanding the information provided will improve Outcome: Not Progressing   Problem: Education: Goal: Ability to make informed decisions regarding treatment will improve Outcome: Not Progressing

## 2019-08-05 NOTE — Progress Notes (Signed)
   08/04/19 2200  Psych Admission Type (Psych Patients Only)  Admission Status Involuntary  Psychosocial Assessment  Patient Complaints None  Eye Contact Glaring  Facial Expression Angry  Affect Angry;Labile;Irritable  Speech Logical/coherent  Interaction Assertive;Demanding  Motor Activity Restless  Appearance/Hygiene Unremarkable  Behavior Characteristics Agressive verbally;Agitated;Irritable  Mood Anxious;Angry  Aggressive Behavior  Targets Property  Type of Behavior Other (Comment) (throwing snacks on the floor in the hall)  Effect No apparent injury  Thought Process  Coherency WDL  Content Blaming others  Delusions None reported or observed  Perception WDL  Hallucination None reported or observed  Judgment Poor  Confusion None  Danger to Self  Current suicidal ideation? Denies  Self-Injurious Behavior No self-injurious ideation or behavior indicators observed or expressed   Danger to Others  Danger to Others Reported or observed  Danger to Others Abnormal  Harmful Behavior to others Threats of violence towards other people observed or expressed   Destructive Behavior Threats of violence towards property observed or expressed   Description of Harmful Behavior cursing at staff  Description of Destructive Behavior throwing snacks out room door   Pt observed to have labile moods r/t lack of communication with someone from his list of contacts. Staff reported calling this person all day and this writer also called number twice with no reply or call back. Pt increasingly agitated and preoccupied. Pt verbally aggressive towards staff when asked to calm down. Pt given IM injections of Geodon 20 mg and Ativan 2 mg. Pt still verbally and physically aggressive even after injections until medications began to take effect.

## 2019-08-05 NOTE — Progress Notes (Signed)
Received notification from the lab that patient is COVID positive. Dr. Mallie Darting notified.

## 2019-08-05 NOTE — Progress Notes (Signed)
Brendan Reyes Subjective: Patient is a 53 year old male with a past psychiatric history significant for bipolar disorder who was admitted on 08/03/2019 secondary to change of behavior as well as suicidal ideation.  Objective: Patient is seen in follow-up.  Patient is a 53 year old male with the above-stated past psychiatric history who is seen in follow-up.  He seems very apprehensive today.  Review of his notes from 12/11 was basically retelling the story about issues with a woman he was living with, and the fact that he "wants to die".  He denied suicidal ideation.  He stated that he did not want to kill himself, but that he was unable to cope with the day-to-day struggles of life and "wanted to be dead".  He seems a bit irritable this morning.  He remains somewhat agitated and frustrated.  Currently had had a fairly recent hospitalization in [place], but he is not signed the authorization for care everywhere, so I am unable to obtain those records.  He reportedly had been on Seroquel and Depakote in the past.  His current medications include Depakote, doxepin, lithium, Seroquel and Ambien.  His vital signs are stable, he is afebrile.  He slept 7.5 hours last night.  The only laboratories in our system are his lithium level which was at less than 0.06 on 12/12.  Principal Problem: Severe bipolar I disorder, current or most recent episode depressed (HCC) Diagnosis: Principal Problem:   Severe bipolar I disorder, current or most recent episode depressed (HCC)  Total Time spent with patient: 30 minutes  Past Psychiatric History: See admission H&P  Past Medical History: History reviewed. No pertinent past medical history. History reviewed. No pertinent surgical history. Family History: History reviewed. No pertinent family history. Family Psychiatric  History: See admission H&P Social History:  Social History   Substance and Sexual  Activity  Alcohol Use None     Social History   Substance and Sexual Activity  Drug Use Not on file    Social History   Socioeconomic History  . Marital status: Single    Spouse name: Not on file  . Number of children: Not on file  . Years of education: Not on file  . Highest education level: Not on file  Occupational History  . Not on file  Tobacco Use  . Smoking status: Current Every Day Smoker    Packs/day: 1.00    Types: Cigarettes  . Smokeless tobacco: Never Used  Substance and Sexual Activity  . Alcohol use: Not on file  . Drug use: Not on file  . Sexual activity: Not on file  Other Topics Concern  . Not on file  Social History Narrative  . Not on file   Social Determinants of Health   Financial Resource Strain:   . Difficulty of Paying Living Expenses: Not on file  Food Insecurity:   . Worried About Programme researcher, broadcasting/film/video in the Last Year: Not on file  . Ran Out of Food in the Last Year: Not on file  Transportation Needs:   . Lack of Transportation (Medical): Not on file  . Lack of Transportation (Non-Medical): Not on file  Physical Activity:   . Days of Exercise per Week: Not on file  . Minutes of Exercise per Session: Not on file  Stress:   . Feeling of Stress : Not on file  Social Connections:   . Frequency of Communication with Friends and Family: Not  on file  . Frequency of Social Gatherings with Friends and Family: Not on file  . Attends Religious Services: Not on file  . Active Member of Clubs or Organizations: Not on file  . Attends Archivist Meetings: Not on file  . Marital Status: Not on file   Additional Social History:                         Sleep: Fair  Appetite:  Good  Current Medications: Current Facility-Administered Medications  Medication Dose Route Frequency Provider Last Rate Last Admin  . acetaminophen (TYLENOL) tablet 650 mg  650 mg Oral Q6H PRN Cristofano, Dorene Ar, MD      . alum & mag hydroxide-simeth  (MAALOX/MYLANTA) 200-200-20 MG/5ML suspension 30 mL  30 mL Oral Q4H PRN Cristofano, Paul A, MD      . divalproex (DEPAKOTE) DR tablet 500 mg  500 mg Oral Q12H Cristofano, Dorene Ar, MD   500 mg at 08/04/19 0804  . doxepin (SINEQUAN) capsule 25 mg  25 mg Oral QHS PRN Cristofano, Dorene Ar, MD      . hydrOXYzine (ATARAX/VISTARIL) tablet 50 mg  50 mg Oral TID PRN Money, Lowry Ram, FNP      . lithium carbonate (LITHOBID) CR tablet 300 mg  300 mg Oral Q12H Clapacs, Madie Reno, MD   300 mg at 08/04/19 0804  . LORazepam (ATIVAN) tablet 2 mg  2 mg Oral Q4H PRN Clapacs, Madie Reno, MD   2 mg at 08/05/19 2025   Or  . LORazepam (ATIVAN) injection 2 mg  2 mg Intramuscular Q4H PRN Clapacs, Madie Reno, MD   2 mg at 08/04/19 2042  . magnesium hydroxide (MILK OF MAGNESIA) suspension 30 mL  30 mL Oral Daily PRN Cristofano, Paul A, MD      . nicotine (NICODERM CQ - dosed in mg/24 hours) patch 21 mg  21 mg Transdermal Daily Cristofano, Paul A, MD      . QUEtiapine (SEROQUEL) tablet 200 mg  200 mg Oral QHS Cristofano, Paul A, MD   200 mg at 08/02/19 2153  . QUEtiapine (SEROQUEL) tablet 50 mg  50 mg Oral BID Money, Lowry Ram, FNP      . ziprasidone (GEODON) injection 20 mg  20 mg Intramuscular Q6H PRN Clapacs, Madie Reno, MD   20 mg at 08/04/19 2042  . zolpidem (AMBIEN) tablet 5 mg  5 mg Oral QHS PRN Cristofano, Dorene Ar, MD        Lab Results:  Results for orders placed or performed during the hospital encounter of 08/01/19 (from the past 48 hour(s))  Lithium level     Status: Abnormal   Collection Time: 08/05/19  6:58 AM  Result Value Ref Range   Lithium Lvl <0.06 (L) 0.60 - 1.20 mmol/L    Comment: Performed at [facility], [address], [place], [place] [address]    Blood Alcohol level:  No results found for: [facility]  Metabolic Disorder Labs: No results found for: HGBA1C, MPG No results found for: PROLACTIN No results found for: CHOL, TRIG, HDL, CHOLHDL, VLDL, LDLCALC  Physical Findings: AIMS:  , ,  ,  ,    CIWA:     COWS:     Musculoskeletal: Strength & Muscle Tone: within normal limits Gait & Station: normal Patient leans: N/A  Psychiatric Specialty Exam: Physical Exam  Nursing note and vitals reviewed. Constitutional: He is oriented to person, place, and time. He appears  well-developed and well-nourished.  HENT:  Head: Normocephalic and atraumatic.  Respiratory: Effort normal.  Neurological: He is alert and oriented to person, place, and time.    Review of Systems  Blood pressure 135/80, pulse 94, temperature 98.4 F (36.9 C), temperature source Oral, resp. rate 18, height 6\' 1"  (1.854 m), weight 98 kg, SpO2 100 %.Body mass index is 28.5 kg/m.  General Appearance: Casual  Eye Contact:  Fair  Speech:  Normal Rate  Volume:  Increased  Mood:  Anxious, Dysphoric and Irritable  Affect:  Labile  Thought Process:  Coherent and Descriptions of Associations: Circumstantial  Orientation:  Full (Time, Place, and Person)  Thought Content:  Rumination  Suicidal Thoughts:  No  Homicidal Thoughts:  No  Memory:  Immediate;   Fair Recent;   Fair Remote;   Fair  Judgement:  Impaired  Insight:  Lacking  Psychomotor Activity:  Increased  Concentration:  Concentration: Fair and Attention Span: Fair  Recall:  [employer]Fair  Fund of Knowledge:  Fair  Language:  Good  Akathisia:  Negative  Handed:  Right  AIMS (if indicated):     Assets:  Desire for Improvement Resilience  ADL's:  Intact  Cognition:  WNL  Sleep:  Number of Hours: 7.5     Treatment Plan Summary: Daily contact with patient to assess and evaluate symptoms and progress in treatment, Medication management and Plan : Patient is seen and examined.  Patient is a 53 year old male with the above-stated past psychiatric history who is seen in follow-up.   Diagnosis: #1 bipolar disorder, most recently mixed, severe  Patient is seen in follow-up.  He appears to be still significantly irritable and depressed.  He has recently been started on  Depakote with his lithium as well as the Seroquel.  This is really only been for 1 day.  His lithium level on admission was essentially negative.  No changes medications today, and hopefully as the kick in his mood will improve. 1.  Continue Depakote DR 500 mg p.o. every 12 hours for mood stability. 2.  Continue doxepin 25 mg p.o. nightly as needed insomnia. 3.  Continue hydroxyzine 50 mg p.o. 3 times daily as needed anxiety. 4.  Continue lithium carbonate CR 300 mg p.o. every 12 hours for mood stability. 5.  Continue Seroquel 50 mg p.o. twice daily and 200 mg p.o. nightly for mood stability. 6.  Continue Geodon 20 mg IM every 12 hours as needed agitation. 7.  Continue Ambien 5 mg p.o. nightly as needed insomnia. 8.  Disposition planning-progress.  Antonieta PertGreg Lawson Brean Carberry, MD 08/05/2019, 10:56 AM

## 2019-08-05 NOTE — [facility] Group Notes (Signed)
Type of Therapy and Topic:  Group Therapy:  Trust and Honesty 08/05/2019 1:30pm Participation Level:  Did Not Attend   Description of Group:     In this group patients will be asked to explore the value of being honest.  Patients will be guided to discuss their thoughts, feelings, and behaviors related to honesty and trusting in others. Patients will process together how trust and honesty relate to forming relationships with peers, family members, and self. Each patient will be challenged to identify and express feelings of being vulnerable. Patients will discuss reasons why people are dishonest and identify alternative outcomes if one was truthful (to self or others). This group will be process-oriented, with patients participating in exploration of their own experiences, giving and receiving support, and processing challenge from other group members.    Therapeutic Goals:  1.  Patient will identify why honesty is important to relationships and how honesty overall affects relationships.  2.  Patient will identify a situation where they lied or were lied too and the  feelings, thought process, and behaviors surrounding the situation  3.  Patient will identify the meaning of being vulnerable, how that feels, and how that correlates to being honest with self and others.  4.  Patient will identify situations where they could have told the truth, but instead lied and explain reasons of dishonesty.    Summary of Patient Progress:  Announcement made via overhead page, patient did not attend.   Therapeutic Modalities:   Cognitive Behavioral Therapy Solution Focused Therapy Motivational Interviewing Brief Therapy 

## 2019-08-06 ENCOUNTER — Inpatient Hospital Stay: Payer: PRIVATE HEALTH INSURANCE

## 2019-08-06 ENCOUNTER — Other Ambulatory Visit: Payer: Self-pay

## 2019-08-06 LAB — CBC WITH DIFFERENTIAL/PLATELET
Abs Immature Granulocytes: 0.01 10*3/uL (ref 0.00–0.07)
Basophils Absolute: 0 10*3/uL (ref 0.0–0.1)
Basophils Relative: 1 %
Eosinophils Absolute: 0.2 10*3/uL (ref 0.0–0.5)
Eosinophils Relative: 3 %
HCT: 49.5 % (ref 39.0–52.0)
Hemoglobin: 16.8 g/dL (ref 13.0–17.0)
Immature Granulocytes: 0 %
Lymphocytes Relative: 35 %
Lymphs Abs: 2.2 10*3/uL (ref 0.7–4.0)
MCH: 31.1 pg (ref 26.0–34.0)
MCHC: 33.9 g/dL (ref 30.0–36.0)
MCV: 91.5 fL (ref 80.0–100.0)
Monocytes Absolute: 0.6 10*3/uL (ref 0.1–1.0)
Monocytes Relative: 9 %
Neutro Abs: 3.3 10*3/uL (ref 1.7–7.7)
Neutrophils Relative %: 52 %
Platelets: 293 10*3/uL (ref 150–400)
RBC: 5.41 MIL/uL (ref 4.22–5.81)
RDW: 12.3 % (ref 11.5–15.5)
WBC: 6.2 10*3/uL (ref 4.0–10.5)
nRBC: 0 % (ref 0.0–0.2)

## 2019-08-06 LAB — PROTIME-INR
INR: 1 (ref 0.8–1.2)
Prothrombin Time: 12.6 seconds (ref 11.4–15.2)

## 2019-08-06 LAB — RESPIRATORY PANEL BY RT PCR (FLU A&B, COVID)
Influenza A by PCR: NEGATIVE
Influenza B by PCR: NEGATIVE
SARS Coronavirus 2 by RT PCR: POSITIVE — AB

## 2019-08-06 LAB — TROPONIN I (HIGH SENSITIVITY)
Troponin I (High Sensitivity): 3 ng/L (ref <18)
Troponin I (High Sensitivity): 3 ng/L (ref <18)

## 2019-08-06 LAB — PROCALCITONIN: Procalcitonin: <0.1 ng/mL

## 2019-08-06 LAB — BASIC METABOLIC PANEL WITH GFR
Anion gap: 9 (ref 5–15)
BUN: 18 mg/dL (ref 6–20)
CO2: 27 mmol/L (ref 22–32)
Calcium: 9 mg/dL (ref 8.9–10.3)
Chloride: 105 mmol/L (ref 98–111)
Creatinine, Ser: 1.19 mg/dL (ref 0.61–1.24)
GFR calc Af Amer: >60 mL/min (ref >60)
GFR calc non Af Amer: >60 mL/min (ref >60)
Glucose, Bld: 90 mg/dL (ref 70–99)
Potassium: 3.7 mmol/L (ref 3.5–5.1)
Sodium: 141 mmol/L (ref 135–145)

## 2019-08-06 LAB — ABO/RH: ABO/RH(D): A POS

## 2019-08-06 LAB — FIBRIN DERIVATIVES D-DIMER (ARMC ONLY): Fibrin derivatives D-dimer (ARMC): 325.12 ng/mL (FEU) (ref 0.00–499.00)

## 2019-08-06 LAB — FIBRINOGEN: Fibrinogen: 337 mg/dL (ref 210–475)

## 2019-08-06 MED ORDER — ALUM & MAG HYDROXIDE-SIMETH 200-200-20 MG/5ML PO SUSP
30.0000 mL | Freq: Once | ORAL | Status: AC
  Administered 2019-08-06: 30 mL via ORAL
  Filled 2019-08-06: qty 30

## 2019-08-06 MED ORDER — HALOPERIDOL 5 MG PO TABS: 10.0000 mg | ORAL_TABLET | Freq: Three times a day (TID) | ORAL | Status: DC | PRN

## 2019-08-06 MED ORDER — ZINC SULFATE 220 (50 ZN) MG PO CAPS
220.0000 mg | ORAL_CAPSULE | Freq: Every day | ORAL | Status: DC
  Administered 2019-08-08: 220 mg via ORAL
  Filled 2019-08-06 (×3): qty 1

## 2019-08-06 MED ORDER — QUETIAPINE FUMARATE 200 MG PO TABS
600.0000 mg | ORAL_TABLET | Freq: Every day | ORAL | Status: DC
  Administered 2019-08-06: 600 mg via ORAL
  Filled 2019-08-06: qty 3

## 2019-08-06 MED ORDER — NITROGLYCERIN 0.4 MG SL SUBL
0.4000 mg | SUBLINGUAL_TABLET | SUBLINGUAL | Status: DC | PRN
  Administered 2019-08-06 (×2): 0.4 mg via SUBLINGUAL
  Filled 2019-08-06 (×3): qty 1

## 2019-08-06 MED ORDER — HYOSCYAMINE SULFATE 0.125 MG SL SUBL
0.2500 mg | SUBLINGUAL_TABLET | Freq: Once | SUBLINGUAL | Status: AC
  Administered 2019-08-06: 0.25 mg via SUBLINGUAL
  Filled 2019-08-06: qty 2

## 2019-08-06 MED ORDER — ZIPRASIDONE MESYLATE 20 MG IM SOLR
20.0000 mg | INTRAMUSCULAR | Status: DC | PRN
  Filled 2019-08-06: qty 20

## 2019-08-06 MED ORDER — LITHIUM CARBONATE ER 450 MG PO TBCR
450.0000 mg | EXTENDED_RELEASE_TABLET | Freq: Two times a day (BID) | ORAL | Status: DC
  Administered 2019-08-06 – 2019-08-08 (×2): 450 mg via ORAL
  Filled 2019-08-06 (×3): qty 1

## 2019-08-06 MED ORDER — LORAZEPAM 1 MG PO TABS
1.0000 mg | ORAL_TABLET | ORAL | Status: AC | PRN
  Administered 2019-08-08: 1 mg via ORAL

## 2019-08-06 MED ORDER — VITAMIN D 25 MCG (1000 UNIT) PO TABS
1000.0000 [IU] | ORAL_TABLET | Freq: Every day | ORAL | Status: DC
  Administered 2019-08-08: 1000 [IU] via ORAL
  Filled 2019-08-06 (×3): qty 1

## 2019-08-06 MED ORDER — DIVALPROEX SODIUM 500 MG PO DR TAB
500.0000 mg | DELAYED_RELEASE_TABLET | Freq: Every day | ORAL | Status: DC
  Administered 2019-08-06 – 2019-08-08 (×2): 500 mg via ORAL
  Filled 2019-08-06 (×3): qty 1

## 2019-08-06 MED ORDER — OLANZAPINE 5 MG PO TBDP: 10.0000 mg | ORAL_TABLET | Freq: Three times a day (TID) | ORAL | Status: DC | PRN

## 2019-08-06 MED ORDER — DIVALPROEX SODIUM 500 MG PO DR TAB
750.0000 mg | DELAYED_RELEASE_TABLET | Freq: Every day | ORAL | Status: DC
  Administered 2019-08-06: 750 mg via ORAL
  Filled 2019-08-06: qty 1

## 2019-08-06 MED ORDER — VITAMIN C 500 MG PO TABS
500.0000 mg | ORAL_TABLET | Freq: Every day | ORAL | Status: DC
  Administered 2019-08-08: 08:00:00 500 mg via ORAL
  Filled 2019-08-06 (×3): qty 1

## 2019-08-06 MED ORDER — HALOPERIDOL LACTATE 5 MG/ML IJ SOLN
10.0000 mg | Freq: Three times a day (TID) | INTRAMUSCULAR | Status: DC | PRN
  Administered 2019-08-07: 10 mg via INTRAMUSCULAR
  Filled 2019-08-06: qty 2

## 2019-08-06 NOTE — Consult Note (Addendum)
[facility] at [facility]   PATIENT NAME: Brendan Reyes    MR#:  [id number]  DATE OF BIRTH:  [birth year]  DATE OF ADMISSION:  08/01/2019  PRIMARY CARE PHYSICIAN: Patient, No Pcp Per   REQUESTING/REFERRING PHYSICIAN: Clapacs, Brendan Denmark  MD  CHIEF COMPLAINT:   Chest pain  HISTORY OF PRESENT ILLNESS:   Brendan Reyes is a 53 year old male with a known history of bipolar disorder with hypomanic and psychotic symptoms, depression and anxiety who is seen in consultation with his psychiatrics Dr. Toni Reyes for chief complaints of chest pain.  Patient states he was walking around the room when he suddenly felt lightheaded and unsteady on his feet. Following that symptoms he developed left sided chest pain associated with nausea without vomiting.  He describes pain as " electrifying" shocking sensation that was radiating to his left arm with some numbness and tingling in his left fingertips.  Denies other associated symptoms of palpitation, syncope, shortness of breath, or diaphoresis.  Patient reports that his father had a massive MI in his 53s.  Patient is afebrile with blood pressure 135/80 mm Hg,  pulse rate 84 beats/min, sats 100% on RA. There were no focal neurological deficits; he was alert and oriented x4, and he did not demonstrate any memory deficits.   PAST MEDICAL HISTORY:  History reviewed. No pertinent past medical history.  PAST SURGICAL HISTOIRY:  History reviewed. No pertinent surgical history.  SOCIAL HISTORY:   Social History   Tobacco Use  . Smoking status: Current Every Day Smoker    Packs/day: 1.00    Types: Cigarettes  . Smokeless tobacco: Never Used  Substance Use Topics  . Alcohol use: Not on file    FAMILY HISTORY:  History reviewed. No pertinent family history.  DRUG ALLERGIES:  Not on File  REVIEW OF SYSTEMS:  Review of Systems  Constitutional: Negative for chills, fever, malaise/fatigue and weight loss.  HENT: Negative for congestion,  hearing loss and sore throat.   Eyes: Negative for blurred vision and double vision.  Respiratory: Negative for cough, shortness of breath and wheezing.   Cardiovascular: Positive for chest pain. Negative for palpitations, orthopnea and leg swelling.  Gastrointestinal: Positive for nausea. Negative for abdominal pain, diarrhea and vomiting.  Genitourinary: Negative for dysuria and urgency.  Musculoskeletal: Negative for myalgias.  Skin: Negative for rash.  Neurological: Positive for dizziness, tingling and sensory change. Negative for speech change, focal weakness and headaches.  Psychiatric/Behavioral: Positive for depression and suicidal ideas. The patient is nervous/anxious.    MEDICATIONS AT HOME:   Prior to Admission medications   Not on File    VITAL SIGNS:  Blood pressure 135/80, pulse 94, temperature 98.4 F (36.9 C), temperature source Oral, resp. rate 18, height 6\' 1"  (1.854 m), weight 98 kg, SpO2 100 %.  PHYSICAL EXAMINATION:   GENERAL:  53 y.o.-year-old patient lying in the bed with no acute distress.  EYES: Pupils equal, round, reactive to light and accommodation. No scleral icterus. Extraocular muscles intact.  HEENT: Head atraumatic, normocephalic. Oropharynx and nasopharynx clear.  NECK:  Supple, no jugular venous distention. No thyroid enlargement, no tenderness.  LUNGS: Normal breath sounds bilaterally, no wheezing, rales,rhonchi or crepitation. No use of accessory muscles of respiration.  CARDIOVASCULAR: S1, S2 normal. No murmurs, rubs, or gallops.  ABDOMEN: Soft, nontender, nondistended. Bowel sounds present. No organomegaly or mass.  EXTREMITIES: No pedal edema, cyanosis, or clubbing.  NEUROLOGIC: Cranial nerves II through XII are intact. Muscle strength 5/5 in all  extremities. Sensation intact. Gait not checked.  PSYCHIATRIC: The patient is alert and oriented x 3.  SKIN: No obvious rash, lesion, or ulcer.   LABORATORY PANEL:   CBC Recent Labs  Lab  08/06/19 1515  WBC 6.2  HGB 16.8  HCT 49.5  PLT 293   ------------------------------------------------------------------------------------------------------------------  Chemistries  Recent Labs  Lab 08/06/19 2034  NA 141  K 3.7  CL 105  CO2 27  GLUCOSE 90  BUN 18  CREATININE 1.19  CALCIUM 9.0   ------------------------------------------------------------------------------------------------------------------  Cardiac Enzymes No results for input(s): TROPONINI in the last 168 hours. ------------------------------------------------------------------------------------------------------------------  RADIOLOGY:  DG Chest 1 View  Result Date: 08/06/2019 CLINICAL DATA:  Chest pain EXAM: CHEST  1 VIEW COMPARISON:  None. FINDINGS: The heart size and mediastinal contours are within normal limits. Both lungs are clear. The visualized skeletal structures are unremarkable. IMPRESSION: No active disease. Electronically Signed   By: Brendan Reyes M.D.   On: 08/06/2019 20:53   EKG:   Orders placed or performed during the hospital encounter of 08/01/19  . EKG 12-Lead  . EKG 12-Lead    EKG: normal EKG, normal sinus rhythm.  IMPRESSION AND PLAN:  53 year old male with a known history of bipolar disorder with hypomanic and psychotic symptoms, depression and anxiety, COVID 19 illness being evaluated for chest pain per request of his psychiatrics Brendan Reyes  1. Atypical chest pain - Unlikely that this is cardiac related given no dynamic EKG changes with normal cardiac enzymes.  Have low suspicions for PE or ischemic event.  Suspect symptoms maybe related to his underlying anxiety/psychiatric disorder? Patient was reported to be agitated and banging on the door at intervals when informed of his positve Covid status. He apparently is not happy with being isolated. - Negative troponin x 2, continue to trend - Received Maalox for possible acid reflux - ASA 81mg  PO daily  - Check lipid panel   - NTG PRN for chest pain administered but he continues to state that he is "having a heart attack" - Chest xray negative for active cardiopulmonary process - Recommend cardiology consult if he continues to complain of chest pain for further work up including stress test of echocardiogram if warranted. - Continue management of underlying medical condition per psychiatric team.    No further work up at from hospitalist stand point but  If further questions arise, please call or page at that time.  Thank you for allowing [facility]  to participate in the care of this patient.    CODE STATUS: Full code  TOTAL TIME TAKING CARE OF THIS PATIENT: 40 minutes.   Rufina Falco, DNP, CCRN, FNP-C [facility] Nurse Practitioner Between 7pm to 7am - Pager [phone] Actively using [employer] secure chat messaging  After 7am go to [email or web address] - [email or web address]  Office  [phone]   CC: Primary care Physician: Patient, No Pcp Per   Note: This dictation was prepared with Dragon dictation along with smaller phrase technology. Any transcriptional errors that result from this process are unintentional.

## 2019-08-06 NOTE — [facility] Group Notes (Signed)
Group cancelled due to COVID-19.  Keara Pagliarulo  CUEBAS-COLON, LCSW  08/06/2019, 11:30AM 

## 2019-08-06 NOTE — Progress Notes (Signed)
D: Patient has been agitated and banging on the door at intervals. Informed of his Covid positive status and instructed to stay in his room and wear a mask. Patient non-compliant with wearing a mask. Demanding to talk with his family, Patient given Ativan 2 mg IM and Geodon 20 mg IM. Staff assisted patient with making phone call on [employer] phone. Continuing to bang on door at intervals, complaining of being too hot and too cold intermittently.  A: continue to monitor for safety. R: Safety maintained.

## 2019-08-06 NOTE — Progress Notes (Signed)
[facility] MD Progress Note  08/06/2019 9:53 AM Brendan Reyes  MRN:  [id number] Subjective:  Patient is a 53 year old male with a past psychiatric history significant for bipolar disorder who was admitted on 08/03/2019 secondary to change of behavior as well as suicidal ideation.  Objective: Patient is seen in follow-up.  Patient is a 53 year old male with the above-stated past psychiatric history seen in follow-up.  He is more angry and irritable today.  He tested positive for coronavirus and we had to isolate him on 1 waiting of the ward.  He is not happy about that.  He is angry and hostile.  He threw a chair at the door.  He keeps yelling profanities.  We were forced to do an intramuscular injection of Geodon to try and settle him down.  Review of his medications led me to increase his Seroquel, his Depakote and his lithium.  He continues to state he does not understand.  He stated he does not want to kill himself, but he wants to die.  His most recent vital signs were stable, he is afebrile.  He reportedly slept 7.5 hours.  Principal Problem: Severe bipolar I disorder, current or most recent episode depressed (HCC) Diagnosis: Principal Problem:   Severe bipolar I disorder, current or most recent episode depressed (HCC)  Total Time spent with patient: 20 minutes  Past Psychiatric History: See admission H&P  Past Medical History: History reviewed. No pertinent past medical history. History reviewed. No pertinent surgical history. Family History: History reviewed. No pertinent family history. Family Psychiatric  History: See admission H&P Social History:  Social History   Substance and Sexual Activity  Alcohol Use None     Social History   Substance and Sexual Activity  Drug Use Not on file    Social History   Socioeconomic History  . Marital status: Single    Spouse name: Not on file  . Number of children: Not on file  . Years of education: Not on file  . Highest education level: Not  on file  Occupational History  . Not on file  Tobacco Use  . Smoking status: Current Every Day Smoker    Packs/day: 1.00    Types: Cigarettes  . Smokeless tobacco: Never Used  Substance and Sexual Activity  . Alcohol use: Not on file  . Drug use: Not on file  . Sexual activity: Not on file  Other Topics Concern  . Not on file  Social History Narrative  . Not on file   Social Determinants of Health   Financial Resource Strain:   . Difficulty of Paying Living Expenses: Not on file  Food Insecurity:   . Worried About Programme researcher, broadcasting/film/video in the Last Year: Not on file  . Ran Out of Food in the Last Year: Not on file  Transportation Needs:   . Lack of Transportation (Medical): Not on file  . Lack of Transportation (Non-Medical): Not on file  Physical Activity:   . Days of Exercise per Week: Not on file  . Minutes of Exercise per Session: Not on file  Stress:   . Feeling of Stress : Not on file  Social Connections:   . Frequency of Communication with Friends and Family: Not on file  . Frequency of Social Gatherings with Friends and Family: Not on file  . Attends Religious Services: Not on file  . Active Member of Clubs or Organizations: Not on file  . Attends Banker Meetings: Not on file  .  Marital Status: Not on file   Additional Social History:                         Sleep: Good  Appetite:  Good  Current Medications: Current Facility-Administered Medications  Medication Dose Route Frequency Provider Last Rate Last Admin  . acetaminophen (TYLENOL) tablet 650 mg  650 mg Oral Q6H PRN Cristofano, Worthy Rancher, MD      . alum & mag hydroxide-simeth (MAALOX/MYLANTA) 200-200-20 MG/5ML suspension 30 mL  30 mL Oral Q4H PRN Cristofano, Worthy Rancher, MD      . Melene Muller ON 08/07/2019] divalproex (DEPAKOTE) DR tablet 500 mg  500 mg Oral Daily Antonieta Pert, MD      . divalproex (DEPAKOTE) DR tablet 750 mg  750 mg Oral QHS Antonieta Pert, MD      . doxepin  (SINEQUAN) capsule 25 mg  25 mg Oral QHS PRN Cristofano, Worthy Rancher, MD      . haloperidol (HALDOL) tablet 10 mg  10 mg Oral Q8H PRN Antonieta Pert, MD       Or  . haloperidol lactate (HALDOL) injection 10 mg  10 mg Intramuscular Q8H PRN Antonieta Pert, MD      . hydrOXYzine (ATARAX/VISTARIL) tablet 50 mg  50 mg Oral TID PRN Money, Gerlene Burdock, FNP      . lithium carbonate (ESKALITH) CR tablet 450 mg  450 mg Oral Q12H Antonieta Pert, MD      . LORazepam (ATIVAN) tablet 2 mg  2 mg Oral Q4H PRN Clapacs, Jackquline Denmark, MD   2 mg at 08/05/19 8295   Or  . LORazepam (ATIVAN) injection 2 mg  2 mg Intramuscular Q4H PRN Clapacs, Jackquline Denmark, MD   2 mg at 08/05/19 1522  . OLANZapine zydis (ZYPREXA) disintegrating tablet 10 mg  10 mg Oral Q8H PRN Antonieta Pert, MD       And  . LORazepam (ATIVAN) tablet 1 mg  1 mg Oral PRN Antonieta Pert, MD       And  . ziprasidone (GEODON) injection 20 mg  20 mg Intramuscular PRN Antonieta Pert, MD      . magnesium hydroxide (MILK OF MAGNESIA) suspension 30 mL  30 mL Oral Daily PRN Cristofano, Paul A, MD      . nicotine (NICODERM CQ - dosed in mg/24 hours) patch 21 mg  21 mg Transdermal Daily Cristofano, Worthy Rancher, MD   21 mg at 08/05/19 1255  . QUEtiapine (SEROQUEL) tablet 50 mg  50 mg Oral BID Money, Gerlene Burdock, FNP   Stopped at 08/05/19 1634  . QUEtiapine (SEROQUEL) tablet 600 mg  600 mg Oral QHS Antonieta Pert, MD      . ziprasidone (GEODON) injection 20 mg  20 mg Intramuscular Q6H PRN Clapacs, Jackquline Denmark, MD   20 mg at 08/05/19 1253    Lab Results:  Results for orders placed or performed during the hospital encounter of 08/01/19 (from the past 48 hour(s))  Lithium level     Status: Abnormal   Collection Time: 08/05/19  6:58 AM  Result Value Ref Range   Lithium Lvl <0.06 (L) 0.60 - 1.20 mmol/L    Comment: Performed at [facility], [address], [place], [place] [address]  Respiratory Panel by RT PCR (Flu A&B, Covid) -     Status: Abnormal    Collection Time: 08/05/19  7:00 PM  Result Value Ref Range  SARS Coronavirus 2 by RT PCR POSITIVE (A) NEGATIVE    Comment: RESULT CALLED TO, READ BACK BY AND VERIFIED WITH: BARBARA GOODMAN AT 2037 ON 08/05/19 RWW (NOTE) SARS-CoV-2 target nucleic acids are DETECTED. SARS-CoV-2 RNA is generally detectable in upper respiratory specimens  during the acute phase of infection. Positive results are indicative of the presence of the identified virus, but do not rule out bacterial infection or co-infection with other pathogens not detected by the test. Clinical correlation with patient history and other diagnostic information is necessary to determine patient infection status. The expected result is Negative. Fact Sheet for Patients:  https://www.moore.com/https://www.fda.gov/media/142436/download Fact Sheet for Healthcare Providers: https://www.young.biz/https://www.fda.gov/media/142435/download This test is not yet approved or cleared by the [place] FDA and  has been authorized for detection and/or diagnosis of SARS-CoV-2 by FDA under an Emergency Use Authorization (EUA).  This EUA will remain in effect (meaning this test can be use d) for the duration of  the COVID-19 declaration under Section 564(b)(1) of the Act, 21 U.S.C. section 360bbb-3(b)(1), unless the authorization is terminated or revoked sooner.    Influenza A by PCR NEGATIVE NEGATIVE   Influenza B by PCR NEGATIVE NEGATIVE    Comment: (NOTE) The Xpert Xpress SARS-CoV-2/FLU/RSV assay is intended as an aid in  the diagnosis of influenza from Nasopharyngeal swab specimens and  should not be used as a sole basis for treatment. Nasal washings and  aspirates are unacceptable for Xpert Xpress SARS-CoV-2/FLU/RSV  testing. Fact Sheet for Patients: https://www.moore.com/https://www.fda.gov/media/142436/download Fact Sheet for Healthcare Providers: https://www.young.biz/https://www.fda.gov/media/142435/download This test is not yet approved or cleared by the [place] FDA and  has been authorized for detection  and/or diagnosis of SARS-CoV-2 by  FDA under an Emergency Use Authorization (EUA). This EUA will remain  in effect (meaning this test can be used) for the duration of the  Covid-19 declaration under Section 564(b)(1) of the Act, 21  U.S.C. section 360bbb-3(b)(1), unless the authorization is  terminated or revoked. Performed at [facility], [address] [place], [place] [address] CORRECTED ON 12/13 AT [id number]: PREVIOUSLY REPORTED A S NEGATIVE RESULT CALLED TO, READ BACK BY AND VERIFIED WITH: BARBARA GOODMAN AT 2037 ON 08/05/19 RWW     Blood Alcohol level:  No results found for: [facility]  Metabolic Disorder Labs: No results found for: HGBA1C, MPG No results found for: PROLACTIN No results found for: CHOL, TRIG, HDL, CHOLHDL, VLDL, LDLCALC  Physical Findings: AIMS:  , ,  ,  ,    CIWA:    COWS:     Musculoskeletal: Strength & Muscle Tone: within normal limits Gait & Station: normal Patient leans: N/A  Psychiatric Specialty Exam: Physical Exam  Nursing note and vitals reviewed. Constitutional: He is oriented to person, place, and time. He appears well-developed and well-nourished.  HENT:  Head: Normocephalic and atraumatic.  Respiratory: Effort normal.  Neurological: He is alert and oriented to person, place, and time.    Review of Systems  Blood pressure 135/80, pulse 94, temperature 98.4 F (36.9 C), temperature source Oral, resp. rate 18, height 6\' 1"  (1.854 m), weight 98 kg, SpO2 100 %.Body mass index is 28.5 kg/m.  General Appearance: Casual  Eye Contact:  Good  Speech:  Pressured  Volume:  Increased  Mood:  Angry, Dysphoric and Irritable  Affect:  Congruent  Thought Process:  Coherent and Descriptions of Associations: Tangential  Orientation:  Full (Time, Place, and Person)  Thought Content:  Delusions  Suicidal Thoughts:  No  Homicidal Thoughts:  No  Memory:  Immediate;   Poor Recent;   Poor Remote;   Poor  Judgement:  Impaired  Insight:  Lacking   Psychomotor Activity:  Increased  Concentration:  Concentration: Fair and Attention Span: Fair  Recall:  [employer] of Knowledge:  Fair  Language:  Good  Akathisia:  Negative  Handed:  Right  AIMS (if indicated):     Assets:  Desire for Improvement Resilience  ADL's:  Intact  Cognition:  WNL  Sleep:  Number of Hours: 4     Treatment Plan Summary: Daily contact with patient to assess and evaluate symptoms and progress in treatment, Medication management and Plan : Patient is seen and examined.  Patient is a 53 year old male with the above-stated past psychiatric history who is seen in follow-up.  Diagnosis: #1 bipolar disorder, most recently mixed, severe.  Patient is seen in follow-up.  He is very angry and hostile this morning.  He does not like being behind the locked doors.  I tried to explain to him the isolation secondary to his positive coronavirus test.  We will have to give him Geodon this morning intramuscularly to try calming down.  He is thrown a chair at a window.  I will also increase his Depakote, lithium and Seroquel.  I am going to stop the Ambien.  No other changes in his medicines.  With regard to the positive coronavirus he is asymptomatic at this point. 1.  Increase Depakote DR to 500 mg p.o. daily and 750 mg p.o. nightly for mood stability. 2.  Continue doxepin 25 mg p.o. nightly as needed insomnia. 3.  Haldol 10 mg p.o. or IM every 6 hours as needed agitation. 4.  Continue hydroxyzine 50 mg p.o. 3 times daily as needed anxiety. 5.  Increase lithium carbonate CR to 450 mg p.o. every 12 hours for mood stability. 6.  Continue lorazepam 2 mg either p.o. or IM every 4 hours as needed anxiety or agitation. 7.  Agitation protocol with olanzapine, lorazepam or ziprasidone as needed. 8.  Change Seroquel to 50 mg p.o. twice daily and 600 mg p.o. nightly for mood stability. 9.  Continue Geodon 20 mg IM every 12 hours as needed agitation. 10.  Continue isolation secondary  to positive coronavirus test. 11 disposition planning-in progress. Sharma Covert, MD 08/06/2019, 9:53 AM

## 2019-08-06 NOTE — Plan of Care (Signed)
  Problem: Safety: Goal: Periods of time without injury will increase Outcome: Not Progressing  D: Patient has been agitated and banging on the door at intervals. Informed of his Covid positive status and instructed to stay in his room and wear a mask. Patient non-compliant with wearing a mask. Demanding to talk with his family, Patient given Ativan 2 mg IM and Geodon 20 mg IM. Staff assisted patient with making phone call on [place] phone. Continuing to bang on door at intervals, complaining of being too hot and too cold intermittently.  A: continue to monitor for safety. R: Safety maintained.

## 2019-08-07 LAB — C-REACTIVE PROTEIN: CRP: 0.6 mg/dL (ref <1.0)

## 2019-08-07 LAB — HIV ANTIBODY (ROUTINE TESTING W REFLEX): HIV Screen 4th Generation wRfx: NONREACTIVE

## 2019-08-07 MED ORDER — DIPHENHYDRAMINE HCL 50 MG/ML IJ SOLN
50.0000 mg | Freq: Four times a day (QID) | INTRAMUSCULAR | Status: DC | PRN
  Administered 2019-08-07: 50 mg via INTRAMUSCULAR
  Filled 2019-08-07: qty 1

## 2019-08-07 MED ORDER — DIPHENHYDRAMINE HCL 25 MG PO CAPS: 50.0000 mg | ORAL_CAPSULE | Freq: Four times a day (QID) | ORAL | Status: DC | PRN

## 2019-08-07 NOTE — Progress Notes (Signed)
Patient continues to bang on the doors.

## 2019-08-07 NOTE — Plan of Care (Signed)
Patient asleep so far on this shift. Patient was angry and required PRN medications per MD orders prior to this shift coming on the unit.   Problem: Education: Goal: Emotional status will improve Outcome: Not Progressing Goal: Mental status will improve Outcome: Not Progressing

## 2019-08-07 NOTE — B[facility] Group Notes (Signed)
LCSW Group Therapy Note   08/07/2019 1:00 PM  Type of Therapy and Topic:  Group Therapy:  Overcoming Obstacles   Participation Level:  Did Not Attend   Description of Group:    In this group patients will be encouraged to explore what they see as obstacles to their own wellness and recovery. They will be guided to discuss their thoughts, feelings, and behaviors related to these obstacles. The group will process together ways to cope with barriers, with attention given to specific choices patients can make. Each patient will be challenged to identify changes they are motivated to make in order to overcome their obstacles. This group will be process-oriented, with patients participating in exploration of their own experiences as well as giving and receiving support and challenge from other group members.   Therapeutic Goals: 1. Patient will identify personal and current obstacles as they relate to admission. 2. Patient will identify barriers that currently interfere with their wellness or overcoming obstacles.  3. Patient will identify feelings, thought process and behaviors related to these barriers. 4. Patient will identify two changes they are willing to make to overcome these obstacles:      Summary of Patient Progress Social Work group canceled due to Covid-19.      Therapeutic Modalities:   Cognitive Behavioral Therapy Solution Focused Therapy Motivational Interviewing Relapse Prevention Therapy  Caroly Purewal, MSW, LCSW 08/07/2019 1:12 PM   

## 2019-08-07 NOTE — Plan of Care (Signed)
  Problem: Safety: Goal: Periods of time without injury will increase Outcome: Not Progressing  D: At shift change, patient was complaining of chest pain and tingling sensation radiating from his chest down his left arm. He also complained of a knot in his right chest and I was able to palpate a knot in his right chest. He claims he had to have CPR at some time in his past and says he has had the knot since then. Also complaining of nausea and shortness of breath. Dr. Mallie Darting was called and he ordered hospitalist to see patient. During this time, patient was agitated and banging on the door, claiming he had called 911 and wanted to know when the ambulance would arrive. Hospitalist ordered EKG, CXR,  and labs. Patient reluctantly compliant with these procedures, after much encouragement. Was given one 0.4 mg Nitroglycerin SL for chest pain per order with relief. Labile and agitated. Hospitalist informed him that there were some EKG changes that meant he could be having an MI and that she would consult with the cardiologist and depending on lab results, there was a possibility he could be transferred to cardiology. When patient was informed his labs were reassuring and did not indicate that he needed to be transferred, he became agitated, insisting that we had lied to him and that there was something wrong with his heart. He stated, "if you think I'm not having a heart attack, I will just give myself one". A: Continue close observation for safety R: Safety maintained,

## 2019-08-07 NOTE — Progress Notes (Signed)
Patient is upset/agitated about being in the hospital and he has picked up his chair and threw it at the closed doors. Patient stated to another staff member that he wanted something to help calm him down. Patient received his PRN injection.

## 2019-08-07 NOTE — Plan of Care (Signed)
D- Patient alert and oriented. Patient presented in an irritable mood on assessment, upset that he is still in the hospital. Patient did not endorse any SI/HI/AVH to this Probation officer. Patient also had no stated goals, however, he is preoccupied with leaving.  A- Support and encouragement provided.  Routine safety checks conducted every 15 minutes.  Patient informed to notify staff with problems or concerns.  R- Patient contracts for safety at this time. Patient remains safe at this time.  Problem: Education: Goal: Knowledge of [facility] Education information/materials will improve Outcome: Not Progressing Goal: Emotional status will improve Outcome: Not Progressing Goal: Mental status will improve Outcome: Not Progressing Goal: Verbalization of understanding the information provided will improve Outcome: Not Progressing   Problem: Safety: Goal: Periods of time without injury will increase Outcome: Not Progressing   Problem: Education: Goal: Ability to make informed decisions regarding treatment will improve Outcome: Not Progressing   Problem: Self-Concept: Goal: Ability to disclose and discuss suicidal ideas will improve Outcome: Not Progressing Goal: Will verbalize positive feelings about self Outcome: Not Progressing

## 2019-08-07 NOTE — Progress Notes (Signed)
[id number]  D: At shift change, patient was complaining of chest pain and tingling sensation radiating from his chest down his left arm. He also complained of a knot in his right chest and I was able to palpate a knot in his right chest. He claims he had to have CPR at some time in his past and says he has had the knot since then. Also complaining of nausea and shortness of breath. Dr. Mallie Darting was called and he ordered hospitalist to see patient. During this time, patient was agitated and banging on the door, claiming he had called 911 and wanted to know when the ambulance would arrive. Hospitalist ordered EKG, CXR,  and labs. Patient reluctantly compliant with these procedures, after much encouragement. Was given one 0.4 mg Nitroglycerin SL for chest pain per order with relief. Labile and agitated. Hospitalist informed him that there were some EKG changes that meant he could be having an MI and that she would consult with the cardiologist and depending on lab results, there was a possibility he could be transferred to cardiology. When patient was informed his labs were reassuring and did not indicate that he needed to be transferred, he became agitated, insisting that we had lied to him and that there was something wrong with his heart. He stated, "if you think I'm not having a heart attack, I will just give myself one". A: Continue close observation for safety R: Safety maintained. [phone] D: Patient became increasingly agitated, banging on the door, demanding to be transferred to cardiology. Hitting and kicking the door. Refused Ativan po. Given Ativan 2 mg IM and Geodon 20 mg IM per order. Patient accepted IM in his arm from this writer but was agitated by security's presence and said he was going to fight them if they came near him. Patient continued to bang on the door at intervals, attention seeking and needy, before finally resting. A: Continue to monitor for safety R: Safety maintained. [id number] D:  Patient awakened complaining of shortness of breath, chest pain and agitation. Demanding to know how long he would be here and whether he would be here over Christmas. Medicated with Nitroglycerin and Ativan po. Encouraged patient to make his needs known by ringing the call bell instead of kicking the door. Patient calmer. A: Continue close observation for safety R: safety maintained.

## 2019-08-07 NOTE — Progress Notes (Signed)
Patient was given PRN medication just before scheduled dose of evening medication, so this Probation officer held medication.

## 2019-08-07 NOTE — Progress Notes (Signed)
Patient stated that he was going to take his medication, however, when this writer took medication to him, he stated that he was going to wait until he spoke with the doctor.

## 2019-08-07 NOTE — Progress Notes (Signed)
Recreation Therapy Notes   Date: 08/07/2019  Time: 9:30 am    Unable to offer activity book due to agitation and aggressive behavior at that time.     Akeria Hedstrom LRT/CTRS        Hero Kulish 08/07/2019 11:34 AM

## 2019-08-07 NOTE — BHH Counselor (Signed)
CSW contacted Daryel Gerald at [phone], in an effort to locate a hotel bed for patient.  Pt s Covid positive and homeless, hotels have been identified as possible solution for this population.   Assunta Curtis, MSW, LCSW 08/07/2019 1:11 PM

## 2019-08-07 NOTE — Progress Notes (Signed)
[facility] MD Progress Note  08/07/2019 1:44 PM Brendan Reyes  MRN:  [id number] Subjective: Follow-up for this 53 year old man with a history of bipolar disorder.  Patient is currently on the back hallway because he has been laboratory tested as positive for COVID-19.  The patient is asymptomatic as far as physical symptoms.  He had been in the hospital now for several days with labile agitated behavior and frequent threats to kill himself.  Today the patient is stating that he no longer wants to die and no longer wishes to kill himself.  He states that he wants to be released from the hospital immediately.  However, his presumption was that he would be going to stay with his girlfriend when he left the hospital.  With his consent we have communicated with her and she states that she will not tolerate having him come back home.  She feels like he is high risk for dangerous violent behavior and also is not comfortable with his COVID-19 status.  Informed of this the patient becomes once again agitated and animated starting to threaten getting impulsive. Principal Problem: Severe bipolar I disorder, current or most recent episode depressed (HCC) Diagnosis: Principal Problem:   Severe bipolar I disorder, current or most recent episode depressed (HCC)  Total Time spent with patient: 30 minutes  Past Psychiatric History: Patient evidently has a history of bipolar disorder details are scarce it sounds like he has chronic mood instability  Past Medical History: History reviewed. No pertinent past medical history. History reviewed. No pertinent surgical history. Family History: History reviewed. No pertinent family history. Family Psychiatric  History: See previous Social History:  Social History   Substance and Sexual Activity  Alcohol Use None     Social History   Substance and Sexual Activity  Drug Use Not on file    Social History   Socioeconomic History  . Marital status: Single    Spouse name:  Not on file  . Number of children: Not on file  . Years of education: Not on file  . Highest education level: Not on file  Occupational History  . Not on file  Tobacco Use  . Smoking status: Current Every Day Smoker    Packs/day: 1.00    Types: Cigarettes  . Smokeless tobacco: Never Used  Substance and Sexual Activity  . Alcohol use: Not on file  . Drug use: Not on file  . Sexual activity: Not on file  Other Topics Concern  . Not on file  Social History Narrative  . Not on file   Social Determinants of Health   Financial Resource Strain:   . Difficulty of Paying Living Expenses: Not on file  Food Insecurity:   . Worried About Programme researcher, broadcasting/film/video in the Last Year: Not on file  . Ran Out of Food in the Last Year: Not on file  Transportation Needs:   . Lack of Transportation (Medical): Not on file  . Lack of Transportation (Non-Medical): Not on file  Physical Activity:   . Days of Exercise per Week: Not on file  . Minutes of Exercise per Session: Not on file  Stress:   . Feeling of Stress : Not on file  Social Connections:   . Frequency of Communication with Friends and Family: Not on file  . Frequency of Social Gatherings with Friends and Family: Not on file  . Attends Religious Services: Not on file  . Active Member of Clubs or Organizations: Not on file  .  Attends Banker Meetings: Not on file  . Marital Status: Not on file   Additional Social History:                         Sleep: Fair  Appetite:  Fair  Current Medications: Current Facility-Administered Medications  Medication Dose Route Frequency Provider Last Rate Last Admin  . acetaminophen (TYLENOL) tablet 650 mg  650 mg Oral Q6H PRN Cristofano, Worthy Rancher, MD      . alum & mag hydroxide-simeth (MAALOX/MYLANTA) 200-200-20 MG/5ML suspension 30 mL  30 mL Oral Q4H PRN Cristofano, Worthy Rancher, MD      . cholecalciferol (VITAMIN D3) tablet 1,000 Units  1,000 Units Oral Daily Antonieta Pert, MD    Stopped at 08/06/19 1747  . divalproex (DEPAKOTE) DR tablet 500 mg  500 mg Oral Daily Antonieta Pert, MD   500 mg at 08/06/19 1030  . divalproex (DEPAKOTE) DR tablet 750 mg  750 mg Oral QHS Antonieta Pert, MD   750 mg at 08/06/19 2054  . doxepin (SINEQUAN) capsule 25 mg  25 mg Oral QHS PRN Cristofano, Paul A, MD      . haloperidol (HALDOL) tablet 10 mg  10 mg Oral Q8H PRN Antonieta Pert, MD       Or  . haloperidol lactate (HALDOL) injection 10 mg  10 mg Intramuscular Q8H PRN Antonieta Pert, MD      . hydrOXYzine (ATARAX/VISTARIL) tablet 50 mg  50 mg Oral TID PRN Money, Gerlene Burdock, FNP   50 mg at 08/06/19 2231  . lithium carbonate (ESKALITH) CR tablet 450 mg  450 mg Oral Q12H Antonieta Pert, MD   450 mg at 08/06/19 2055  . LORazepam (ATIVAN) tablet 2 mg  2 mg Oral Q4H PRN Ananda Caya, Jackquline Denmark, MD   2 mg at 08/06/19 2335   Or  . LORazepam (ATIVAN) injection 2 mg  2 mg Intramuscular Q4H PRN Gardenia Witter, Jackquline Denmark, MD   2 mg at 08/05/19 1522  . OLANZapine zydis (ZYPREXA) disintegrating tablet 10 mg  10 mg Oral Q8H PRN Antonieta Pert, MD       And  . LORazepam (ATIVAN) tablet 1 mg  1 mg Oral PRN Antonieta Pert, MD       And  . ziprasidone (GEODON) injection 20 mg  20 mg Intramuscular PRN Antonieta Pert, MD      . magnesium hydroxide (MILK OF MAGNESIA) suspension 30 mL  30 mL Oral Daily PRN Cristofano, Paul A, MD      . nicotine (NICODERM CQ - dosed in mg/24 hours) patch 21 mg  21 mg Transdermal Daily Cristofano, Worthy Rancher, MD   21 mg at 08/05/19 1255  . QUEtiapine (SEROQUEL) tablet 50 mg  50 mg Oral BID Money, Gerlene Burdock, FNP   50 mg at 08/06/19 0347  . QUEtiapine (SEROQUEL) tablet 600 mg  600 mg Oral QHS Antonieta Pert, MD   600 mg at 08/06/19 2054  . vitamin C (ASCORBIC ACID) tablet 500 mg  500 mg Oral Daily Antonieta Pert, MD   Stopped at 08/06/19 1753  . zinc sulfate capsule 220 mg  220 mg Oral Daily Antonieta Pert, MD   Stopped at 08/06/19 1753  . ziprasidone  (GEODON) injection 20 mg  20 mg Intramuscular Q6H PRN Berit Raczkowski, Jackquline Denmark, MD   20 mg at 08/07/19 1308    Lab Results:  Results for orders  placed or performed during the hospital encounter of 08/01/19 (from the past 48 hour(s))  Respiratory Panel by RT PCR (Flu A&B, Covid) -     Status: Abnormal   Collection Time: 08/05/19  7:00 PM  Result Value Ref Range   SARS Coronavirus 2 by RT PCR POSITIVE (A) NEGATIVE    Comment: RESULT CALLED TO, READ BACK BY AND VERIFIED WITH: BARBARA GOODMAN AT 2037 ON 08/05/19 RWW (NOTE) SARS-CoV-2 target nucleic acids are DETECTED. SARS-CoV-2 RNA is generally detectable in upper respiratory specimens  during the acute phase of infection. Positive results are indicative of the presence of the identified virus, but do not rule out bacterial infection or co-infection with other pathogens not detected by the test. Clinical correlation with patient history and other diagnostic information is necessary to determine patient infection status. The expected result is Negative. Fact Sheet for Patients:  https://www.moore.com/ Fact Sheet for Healthcare Providers: https://www.young.biz/ This test is not yet approved or cleared by the [place] FDA and  has been authorized for detection and/or diagnosis of SARS-CoV-2 by FDA under an Emergency Use Authorization (EUA).  This EUA will remain in effect (meaning this test can be use d) for the duration of  the COVID-19 declaration under Section 564(b)(1) of the Act, 21 U.S.C. section 360bbb-3(b)(1), unless the authorization is terminated or revoked sooner.    Influenza A by PCR NEGATIVE NEGATIVE   Influenza B by PCR NEGATIVE NEGATIVE    Comment: (NOTE) The Xpert Xpress SARS-CoV-2/FLU/RSV assay is intended as an aid in  the diagnosis of influenza from Nasopharyngeal swab specimens and  should not be used as a sole basis for treatment. Nasal washings and  aspirates are unacceptable  for Xpert Xpress SARS-CoV-2/FLU/RSV  testing. Fact Sheet for Patients: https://www.moore.com/ Fact Sheet for Healthcare Providers: https://www.young.biz/ This test is not yet approved or cleared by the [place] FDA and  has been authorized for detection and/or diagnosis of SARS-CoV-2 by  FDA under an Emergency Use Authorization (EUA). This EUA will remain  in effect (meaning this test can be used) for the duration of the  Covid-19 declaration under Section 564(b)(1) of the Act, 21  U.S.C. section 360bbb-3(b)(1), unless the authorization is  terminated or revoked. Performed at [facility], [address] [place], [place] [address] CORRECTED ON 12/13 AT 6045: PREVIOUSLY REPORTED A S NEGATIVE RESULT CALLED TO, READ BACK BY AND VERIFIED WITH: BARBARA GOODMAN AT 2037 ON 08/05/19 RWW   Fibrinogen     Status: None   Collection Time: 08/06/19  2:18 PM  Result Value Ref Range   Fibrinogen 337 210 - 475 mg/dL    Comment: Performed at [facility], [address] [place], [place] [address]  Fibrin derivatives D-Dimer [facility] only)     Status: None   Collection Time: 08/06/19  2:18 PM  Result Value Ref Range   Fibrin derivatives D-dimer ([facility]) 325.12 0.00 - 499.00 ng/mL (FEU)    Comment: (NOTE) <> Exclusion of Venous Thromboembolism (VTE) - OUTPATIENT ONLY   (Emergency Department or [place])   0-499 ng/ml (FEU): With a low to intermediate pretest probability                      for VTE this test result excludes the diagnosis                      of VTE.   >499 ng/ml (FEU) : VTE not excluded; additional work up for  VTE is                      required. <> Testing on Inpatients and Evaluation of Disseminated Intravascular   Coagulation (DIC) Reference Range:   0-499 ng/ml (FEU) Performed at [facility], [address] [place], [place] [address]   Protime-INR     Status: None   Collection Time: 08/06/19  3:15 PM   Result Value Ref Range   Prothrombin Time 12.6 11.4 - 15.2 seconds   INR 1.0 0.8 - 1.2    Comment: (NOTE) INR goal varies based on device and disease states. Performed at [facility], [address] [place], [place] [address]   CBC with Differential/Platelet     Status: None   Collection Time: 08/06/19  3:15 PM  Result Value Ref Range   WBC 6.2 4.0 - 10.5 K/uL   RBC 5.41 4.22 - 5.81 MIL/uL   Hemoglobin 16.8 13.0 - 17.0 g/dL   HCT 09.849.5 11.939.0 - 14.752.0 %   MCV 91.5 80.0 - 100.0 fL   MCH 31.1 26.0 - 34.0 pg   MCHC 33.9 30.0 - 36.0 g/dL   RDW 82.912.3 56.211.5 - 13.015.5 %   Platelets 293 150 - 400 K/uL   nRBC 0.0 0.0 - 0.2 %   Neutrophils Relative % 52 %   Neutro Abs 3.3 1.7 - 7.7 K/uL   Lymphocytes Relative 35 %   Lymphs Abs 2.2 0.7 - 4.0 K/uL   Monocytes Relative 9 %   Monocytes Absolute 0.6 0.1 - 1.0 K/uL   Eosinophils Relative 3 %   Eosinophils Absolute 0.2 0.0 - 0.5 K/uL   Basophils Relative 1 %   Basophils Absolute 0.0 0.0 - 0.1 K/uL   Immature Granulocytes 0 %   Abs Immature Granulocytes 0.01 0.00 - 0.07 K/uL    Comment: Performed at [facility], [address] [place], [place] [address]  Troponin I (High Sensitivity)     Status: None   Collection Time: 08/06/19  8:08 PM  Result Value Ref Range   Troponin I (High Sensitivity) 3 <18 ng/L    Comment: (NOTE) Elevated high sensitivity troponin I (hsTnI) values and significant  changes across serial measurements may suggest ACS but many other  chronic and acute conditions are known to elevate hsTnI results.  Refer to the "Links" section for chest pain algorithms and additional  guidance. Performed at [facility], [address] [place], [place] [address]   HIV Antibody (routine testing w rflx)     Status: None   Collection Time: 08/06/19  8:33 PM  Result Value Ref Range   HIV Screen 4th Generation wRfx NON REACTIVE NON REACTIVE    Comment: Performed at [facility], [address]  [place], [place] [address]  ABO/Rh     Status: None   Collection Time: 08/06/19  8:33 PM  Result Value Ref Range   ABO/RH(D)      A POS Performed at [facility], [address] [place], [place] [address]   C-reactive protein     Status: None   Collection Time: 08/06/19  8:33 PM  Result Value Ref Range   CRP 0.6 <1.0 mg/dL    Comment: Performed at [facility], [address] [place], [place] [address]  Procalcitonin - Baseline     Status: None   Collection Time: 08/06/19  8:33 PM  Result Value Ref Range   Procalcitonin <0.10  ng/mL    Comment:        Interpretation: PCT (Procalcitonin) <= 0.5 ng/mL: Systemic infection (sepsis) is not likely. Local bacterial infection is possible. (NOTE)       Sepsis PCT Algorithm           Lower Respiratory Tract                                      Infection PCT Algorithm    ----------------------------     ----------------------------         PCT < 0.25 ng/mL                PCT < 0.10 ng/mL         Strongly encourage             Strongly discourage   discontinuation of antibiotics    initiation of antibiotics    ----------------------------     -----------------------------       PCT 0.25 - 0.50 ng/mL            PCT 0.10 - 0.25 ng/mL               OR       >80% decrease in PCT            Discourage initiation of                                            antibiotics      Encourage discontinuation           of antibiotics    ----------------------------     -----------------------------         PCT >= 0.50 ng/mL              PCT 0.26 - 0.50 ng/mL               AND        <80% decrease in PCT             Encourage initiation of                                             antibiotics       Encourage continuation           of antibiotics    ----------------------------     -----------------------------        PCT >= 0.50 ng/mL                  PCT > 0.50 ng/mL               AND         increase in PCT                  Strongly  encourage                                      initiation of antibiotics    Strongly encourage escalation           of antibiotics                                     -----------------------------  PCT <= 0.25 ng/mL                                                 OR                                        > 80% decrease in PCT                                     Discontinue / Do not initiate                                             antibiotics Performed at [facility], [address] [place], [place] [address]   Basic metabolic panel     Status: None   Collection Time: 08/06/19  8:34 PM  Result Value Ref Range   Sodium 141 135 - 145 mmol/L   Potassium 3.7 3.5 - 5.1 mmol/L   Chloride 105 98 - 111 mmol/L   CO2 27 22 - 32 mmol/L   Glucose, Bld 90 70 - 99 mg/dL   BUN 18 6 - 20 mg/dL   Creatinine, Ser 6.04 0.61 - 1.24 mg/dL   Calcium 9.0 8.9 - 54.0 mg/dL   GFR calc non Af Amer >60 >60 mL/min   GFR calc Af Amer >60 >60 mL/min   Anion gap 9 5 - 15    Comment: Performed at [facility], [address], [place], [place] [address]  Troponin I (High Sensitivity)     Status: None   Collection Time: 08/06/19 10:07 PM  Result Value Ref Range   Troponin I (High Sensitivity) 3 <18 ng/L    Comment: (NOTE) Elevated high sensitivity troponin I (hsTnI) values and significant  changes across serial measurements may suggest ACS but many other  chronic and acute conditions are known to elevate hsTnI results.  Refer to the "Links" section for chest pain algorithms and additional  guidance. Performed at [facility], [address] [place], [place] [address]     Blood Alcohol level:  No results found for: [facility]  Metabolic Disorder Labs: No results found for: HGBA1C, MPG No results found for: PROLACTIN No results found for: CHOL, TRIG, HDL, CHOLHDL, VLDL, LDLCALC  Physical Findings: AIMS:  , ,  ,  ,    CIWA:     COWS:     Musculoskeletal: Strength & Muscle Tone: within normal limits Gait & Station: normal Patient leans: N/A  Psychiatric Specialty Exam: Physical Exam  Nursing note and vitals reviewed. Constitutional: He appears well-developed and well-nourished.  HENT:  Head: Normocephalic and atraumatic.  Eyes: Pupils are equal, round, and reactive to light. Conjunctivae are normal.  Cardiovascular: Regular rhythm and normal heart sounds.  Respiratory: Effort normal.  GI: Soft.  Musculoskeletal:        General: Normal range of motion.     Cervical back: Normal range of motion.  Neurological: He is alert.  Skin: Skin is warm and dry.  Psychiatric: His affect is labile. His speech is tangential. He is  agitated. Thought content is paranoid. Cognition and memory are impaired. He expresses impulsivity and inappropriate judgment.    Review of Systems  Constitutional: Negative.   HENT: Negative.   Eyes: Negative.   Respiratory: Negative.   Cardiovascular: Negative.   Gastrointestinal: Negative.   Musculoskeletal: Negative.   Skin: Negative.   Neurological: Negative.   Psychiatric/Behavioral: Positive for agitation, behavioral problems, decreased concentration and dysphoric mood. Negative for confusion and hallucinations. The patient is nervous/anxious and is hyperactive.     Blood pressure 135/80, pulse 94, temperature 98.4 F (36.9 C), temperature source Oral, resp. rate 18, height 6\' 1"  (1.854 m), weight 98 kg, SpO2 100 %.Body mass index is 28.5 kg/m.  General Appearance: Casual  Eye Contact:  Fair  Speech:  Clear and Coherent  Volume:  Normal  Mood:  Angry, Dysphoric and Irritable  Affect:  Congruent  Thought Process:  Disorganized  Orientation:  Full (Time, Place, and Person)  Thought Content:  Illogical, Rumination and Tangential  Suicidal Thoughts:  No  Homicidal Thoughts:  No  Memory:  Immediate;   Fair Recent;   Fair Remote;   Poor  Judgement:  Impaired  Insight:   Shallow  Psychomotor Activity:  Restlessness  Concentration:  Concentration: Poor  Recall:  Poor  Fund of Knowledge:  Poor  Language:  Fair  Akathisia:  No  Handed:  Right  AIMS (if indicated):     Assets:  Desire for Improvement  ADL's:  Impaired  Cognition:  Impaired,  Mild and Moderate  Sleep:  Number of Hours: 4     Treatment Plan Summary: Daily contact with patient to assess and evaluate symptoms and progress in treatment, Medication management and Plan Patient with bipolar disorder also with chronic impulsivity probably in part related to cognitive disability.  Patient becomes angry upon finding out that he cannot be discharged promptly.  I have spoken with the treatment team and I think we agree that in his current condition he is really not going to be able to care for himself without a stable place to live.  There is some hope that he may be able to get into the coronavirus hotel environment but if this falls through the safest option probably will be not to discharge him today.  We are therefore holding up on the discharge and I have finished the paperwork requesting more time for commitment.  Continue with medicine for bipolar disorder and supportive counseling.  Alethia Berthold, MD 08/07/2019, 1:44 PM

## 2019-08-07 NOTE — Progress Notes (Signed)
CSW spoke with Nathaniel Man regarding process of referring COVID+ patients to the hotel voucher program. Edwyna Ready provided CSW with contact number for Wells Guiles [phone] as the contact for [facility]. CSW contacted Wells Guiles and left a voicemail requesting a call back.   Evalina Field, MSW, LCSW Clinical Social Work 08/07/2019 3:08 PM

## 2019-08-07 NOTE — Progress Notes (Signed)
Recreation Therapy Notes   Date: 08/07/2019  Time: 9:30 am    Recreation Therapy group canceled due to COVID-19.     Cleve Paolillo LRT/CTRS        Congetta Odriscoll 08/07/2019 11:45 AM 

## 2019-08-07 NOTE — Progress Notes (Signed)
Patient stated to this writer "I'm tired of being here, I can't stay in this motherfucker one more night". Patient stated that if he has to stay here another night he will go down and get back up and raise hell again.

## 2019-08-07 NOTE — Progress Notes (Signed)
Patient woke up stating he was hungry. Patient given a tray, patient became irritable and started throwing his food at the door. Patient stated, "If you're going to treat me like an animal, I am going to act like one." Patient upset because he didn't get his dinner tray due to being asleep. Patient given education.

## 2019-08-07 NOTE — Progress Notes (Signed)
This Probation officer was informed that patient stated if he has to stay in here another night he will kill somebody.

## 2019-08-07 NOTE — Progress Notes (Signed)
Patient is on 1-1 for safety with a sitter present. Patient is asleep and has been since getting medication per MD orders before this writers shift started. Patient remains safe on the unit.

## 2019-08-07 NOTE — Progress Notes (Signed)
Patient stated to staff "you know I got Corona, you can try me if you want to".

## 2019-08-08 LAB — HIV-1 RNA QUANT-NO REFLEX-BLD
HIV 1 RNA Quant: <20 copies/mL
LOG10 HIV-1 RNA: UNDETERMINED log10copy/mL

## 2019-08-08 MED ORDER — DIVALPROEX SODIUM 500 MG PO DR TAB: 500.0000 mg | DELAYED_RELEASE_TABLET | Freq: Every day | ORAL | 1 refills | Status: DC

## 2019-08-08 MED ORDER — ZINC SULFATE 220 (50 ZN) MG PO CAPS: 220.0000 mg | ORAL_CAPSULE | Freq: Every day | ORAL | 1 refills | Status: DC

## 2019-08-08 MED ORDER — HYDROXYZINE HCL 50 MG PO TABS: 50.0000 mg | ORAL_TABLET | Freq: Three times a day (TID) | ORAL | 1 refills | Status: DC | PRN

## 2019-08-08 MED ORDER — DIVALPROEX SODIUM 500 MG PO DR TAB: 500.0000 mg | DELAYED_RELEASE_TABLET | Freq: Every day | ORAL | 0 refills | Status: AC

## 2019-08-08 MED ORDER — QUETIAPINE FUMARATE 50 MG PO TABS: 50.0000 mg | ORAL_TABLET | Freq: Two times a day (BID) | ORAL | 1 refills | Status: DC

## 2019-08-08 MED ORDER — QUETIAPINE FUMARATE 300 MG PO TABS: 600.0000 mg | ORAL_TABLET | Freq: Every day | ORAL | 0 refills | Status: AC

## 2019-08-08 MED ORDER — QUETIAPINE FUMARATE 50 MG PO TABS: 50.0000 mg | ORAL_TABLET | Freq: Two times a day (BID) | ORAL | 0 refills | Status: AC

## 2019-08-08 MED ORDER — DIVALPROEX SODIUM 250 MG PO DR TAB: 750.0000 mg | DELAYED_RELEASE_TABLET | Freq: Every day | ORAL | 0 refills | Status: AC

## 2019-08-08 MED ORDER — VITAMIN D3 25 MCG PO TABS: 1000.0000 [IU] | ORAL_TABLET | Freq: Every day | ORAL | 1 refills | Status: DC

## 2019-08-08 MED ORDER — HYDROXYZINE HCL 50 MG PO TABS: 50.0000 mg | ORAL_TABLET | Freq: Three times a day (TID) | ORAL | 0 refills | Status: AC | PRN

## 2019-08-08 MED ORDER — DIVALPROEX SODIUM 250 MG PO DR TAB: 750.0000 mg | DELAYED_RELEASE_TABLET | Freq: Every day | ORAL | 1 refills | Status: DC

## 2019-08-08 MED ORDER — ASCORBIC ACID 500 MG PO TABS: 500.0000 mg | ORAL_TABLET | Freq: Every day | ORAL | 1 refills | Status: DC

## 2019-08-08 MED ORDER — LITHIUM CARBONATE ER 450 MG PO TBCR: 450.0000 mg | EXTENDED_RELEASE_TABLET | Freq: Two times a day (BID) | ORAL | 1 refills | Status: DC

## 2019-08-08 MED ORDER — VITAMIN D3 25 MCG PO TABS: 1000.0000 [IU] | ORAL_TABLET | Freq: Every day | ORAL | 0 refills | Status: AC

## 2019-08-08 MED ORDER — ASCORBIC ACID 500 MG PO TABS: 500.0000 mg | ORAL_TABLET | Freq: Every day | ORAL | 0 refills | Status: AC

## 2019-08-08 MED ORDER — LITHIUM CARBONATE ER 450 MG PO TBCR: 450.0000 mg | EXTENDED_RELEASE_TABLET | Freq: Two times a day (BID) | ORAL | 0 refills | Status: AC

## 2019-08-08 MED ORDER — ZINC SULFATE 220 (50 ZN) MG PO CAPS: 220.0000 mg | ORAL_CAPSULE | Freq: Every day | ORAL | 0 refills | Status: AC

## 2019-08-08 MED ORDER — QUETIAPINE FUMARATE 300 MG PO TABS: 600.0000 mg | ORAL_TABLET | Freq: Every day | ORAL | 1 refills | Status: DC

## 2019-08-08 NOTE — Progress Notes (Signed)
Pt was educated on dc plan and verbalizes understanding. Pt received belongings, prescriptions, medications and dc packet. Pt denies SI, HI and AVS. Collier Bullock RN

## 2019-08-08 NOTE — Tx Team (Signed)
Interdisciplinary Treatment and Diagnostic Plan Update  08/08/2019 Time of Session: 900am Brendan Reyes MRN: [id number]  Principal Diagnosis: Severe bipolar I disorder, current or most recent episode depressed (Brendan Reyes)  Secondary Diagnoses: Principal Problem:   Severe bipolar I disorder, current or most recent episode depressed (Brendan Reyes)   Current Medications:  Current Facility-Administered Medications  Medication Dose Route Frequency Provider Last Rate Last Admin  . acetaminophen (TYLENOL) tablet 650 mg  650 mg Oral Q6H PRN Brendan Reyes, Brendan Ar, MD      . alum & mag hydroxide-simeth (MAALOX/MYLANTA) 200-200-20 MG/5ML suspension 30 mL  30 mL Oral Q4H PRN Brendan Reyes, Brendan Ar, MD      . cholecalciferol (VITAMIN D3) tablet 1,000 Units  1,000 Units Oral Daily Brendan Covert, MD   1,000 Units at 08/08/19 [phone]  . diphenhydrAMINE (BENADRYL) capsule 50 mg  50 mg Oral Q6H PRN Brendan Reyes, Brendan Maffucci B, FNP       Or  . diphenhydrAMINE (BENADRYL) injection 50 mg  50 mg Intramuscular Q6H PRN Brendan Reyes, Brendan Maffucci B, FNP   50 mg at 08/07/19 1645  . divalproex (DEPAKOTE) DR tablet 500 mg  500 mg Oral Daily Brendan Covert, MD   500 mg at 08/08/19 0820  . divalproex (DEPAKOTE) DR tablet 750 mg  750 mg Oral QHS Brendan Covert, MD   750 mg at 08/06/19 2054  . doxepin (SINEQUAN) capsule 25 mg  25 mg Oral QHS PRN Brendan Reyes, Paul A, MD      . haloperidol (HALDOL) tablet 10 mg  10 mg Oral Q8H PRN Brendan Covert, MD       Or  . haloperidol lactate (HALDOL) injection 10 mg  10 mg Intramuscular Q8H PRN Brendan Covert, MD   10 mg at 08/07/19 1645  . hydrOXYzine (ATARAX/VISTARIL) tablet 50 mg  50 mg Oral TID PRN Brendan Reyes, Brendan Ram, FNP   50 mg at 08/06/19 2231  . lithium carbonate (ESKALITH) CR tablet 450 mg  450 mg Oral Q12H Brendan Covert, MD   450 mg at 08/08/19 0820  . LORazepam (ATIVAN) tablet 2 mg  2 mg Oral Q4H PRN Brendan, Madie Reno, MD   2 mg at 08/06/19 2335   Or  . LORazepam (ATIVAN) injection 2 mg  2 mg  Intramuscular Q4H PRN Brendan, Madie Reno, MD   2 mg at 08/07/19 1645  . magnesium hydroxide (MILK OF MAGNESIA) suspension 30 mL  30 mL Oral Daily PRN Brendan Reyes, Paul A, MD      . nicotine (NICODERM CQ - dosed in mg/24 hours) patch 21 mg  21 mg Transdermal Daily Brendan Reyes, Brendan Ar, MD   21 mg at 08/08/19 0824  . OLANZapine zydis (ZYPREXA) disintegrating tablet 10 mg  10 mg Oral Q8H PRN Brendan Covert, MD       And  . ziprasidone (GEODON) injection 20 mg  20 mg Intramuscular PRN Brendan Covert, MD      . QUEtiapine (SEROQUEL) tablet 50 mg  50 mg Oral BID Brendan Reyes, Brendan Ram, FNP   50 mg at 08/08/19 3646  . QUEtiapine (SEROQUEL) tablet 600 mg  600 mg Oral QHS Brendan Covert, MD   600 mg at 08/06/19 2054  . vitamin C (ASCORBIC ACID) tablet 500 mg  500 mg Oral Daily Brendan Covert, MD   500 mg at 08/08/19 8032  . zinc sulfate capsule 220 mg  220 mg Oral Daily Brendan Covert, MD   220 mg at 08/08/19 0820  . ziprasidone (  GEODON) injection 20 mg  20 mg Intramuscular Q6H PRN Brendan, John T, MD   20 mg at 08/07/19 1308   PTA Medications: No medications prior to admission.    Patient Stressors: Health problems Marital or family conflict  Patient Strengths: Capable of independent living Motivation for treatment/growth Religious Affiliation  Treatment Modalities: Medication Management, Group therapy, Case management,  1 to 1 session with clinician, Psychoeducation, Recreational therapy.   Physician Treatment Plan for Primary Diagnosis: Severe bipolar I disorder, current or most recent episode depressed ([facility]) Long Term Goal(s): Improvement in symptoms so as ready for discharge Improvement in symptoms so as ready for discharge   Short Term Goals: Ability to verbalize feelings will improve Ability to disclose and discuss suicidal ideas Ability to demonstrate self-control will improve Ability to identify and develop effective coping behaviors will improve Ability to maintain clinical  measurements within normal limits will improve  Medication Management: Evaluate patient's response, side effects, and tolerance of medication regimen.  Therapeutic Interventions: 1 to 1 sessions, Unit Group sessions and Medication administration.  Evaluation of Outcomes: Adequate for Discharge  Physician Treatment Plan for Secondary Diagnosis: Principal Problem:   Severe bipolar I disorder, current or most recent episode depressed (HCC)  Long Term Goal(s): Improvement in symptoms so as ready for discharge Improvement in symptoms so as ready for discharge   Short Term Goals: Ability to verbalize feelings will improve Ability to disclose and discuss suicidal ideas Ability to demonstrate self-control will improve Ability to identify and develop effective coping behaviors will improve Ability to maintain clinical measurements within normal limits will improve     Medication Management: Evaluate patient's response, side effects, and tolerance of medication regimen.  Therapeutic Interventions: 1 to 1 sessions, Unit Group sessions and Medication administration.  Evaluation of Outcomes: Adequate for Discharge   RN Treatment Plan for Primary Diagnosis: Severe bipolar I disorder, current or most recent episode depressed (HCC) Long Term Goal(s): Knowledge of disease and therapeutic regimen to maintain health will improve  Short Term Goals: Ability to remain free from injury will improve, Ability to verbalize frustration and anger appropriately will improve, Ability to demonstrate self-control, Ability to participate in decision making will improve, Ability to verbalize feelings will improve, Ability to disclose and discuss suicidal ideas, Ability to identify and develop effective coping behaviors will improve and Compliance with prescribed medications will improve  Medication Management: RN will administer medications as ordered by provider, will assess and evaluate patient's response and  provide education to patient for prescribed medication. RN will report any adverse and/or side effects to prescribing provider.  Therapeutic Interventions: 1 on 1 counseling sessions, Psychoeducation, Medication administration, Evaluate responses to treatment, Monitor vital signs and CBGs as ordered, Perform/monitor CIWA, COWS, AIMS and Fall Risk screenings as ordered, Perform wound care treatments as ordered.  Evaluation of Outcomes: Adequate for Discharge   LCSW Treatment Plan for Primary Diagnosis: Severe bipolar I disorder, current or most recent episode depressed (HCC) Long Term Goal(s): Safe transition to appropriate next level of care at discharge, Engage patient in therapeutic group addressing interpersonal concerns.  Short Term Goals: Engage patient in aftercare planning with referrals and resources, Increase ability to appropriately verbalize feelings, Increase emotional regulation and Increase skills for wellness and recovery  Therapeutic Interventions: Assess for all discharge needs, 1 to 1 time with Social worker, Explore available resources and support systems, Assess for adequacy in community support network, Educate family and significant other(s) on suicide prevention, Complete Psychosocial Assessment, Interpersonal group therapy.  Evaluation of Outcomes: Adequate for Discharge   Progress in Treatment: Attending groups: No. Participating in groups: No. Taking medication as prescribed: No. Toleration medication: Yes. Family/Significant other contact made: No, will contact:  when consent is given Patient understands diagnosis: No. Discussing patient identified problems/goals with staff: No. Medical problems stabilized or resolved: Yes. Denies suicidal/homicidal ideation: No. Pt endorses passive SI, contracted for safety on the unit Issues/concerns per patient self-inventory: No. Other: N/Reyes  New problem(s) identified: No, Describe:  none  New Short Term/Long Term  Goal(s): Detox, elimination of AVH/symptoms of psychosis, medication management for mood stabilization; elimination of SI thoughts; development of comprehensive mental wellness/sobriety plan.   Patient Goals:  Pt invited to treatment team and refused to attend, no goal obtained.  Discharge Plan or Barriers: SPE pamphlet, Mobile Crisis information, and AA/NA information provided to patient for additional community support and resources. CSW assessing for appropriate referrals. Update 08/08/19: Pt [facility] [employer] [facility] positive [facility] individuals. Pt will have 7 nights in the hotel and will be provided with hot meal each night and groceries and hygiene supplies. Pt declines follow up and will be given information on mental health providers in the community.  Reason for Continuation of Hospitalization: none  Estimated Length of Stay: Today 08/08/2019  Attendees: Patient:  08/08/2019 10:16 AM  Physician: Dr Toni Amendlapacs MD 08/08/2019 10:16 AM  Nursing:  08/08/2019 10:16 AM  RN Care Manager: 08/08/2019 10:16 AM  Social Worker: Zollie Scalelivia Lorieann Argueta LCSW 08/08/2019 10:16 AM  Recreational Therapist:  08/08/2019 10:16 AM  Other:  08/08/2019 10:16 AM  Other:  08/08/2019 10:16 AM  Other: 08/08/2019 10:16 AM    Scribe for Treatment Team: Charlann Langelivia K Harvel Meskill, LCSW 08/08/2019 10:16 AM

## 2019-08-08 NOTE — Progress Notes (Signed)
CSW spoke with [place] from [facility] [phone] ext [phone]. Selena Batten reported that she would need a few questions answered to assist with placing pt in a hotel due to pt being homeless and having [place]. Selena Batten reported she would need to know how many days pt would need to be housed at hotel, smoking/non smoking room, and if there will be transportation to get pt to the hotel.   CSW spoke with AD Warner Mccreedy who reported pt would need to be housed for 7 days and would probably prefer a smoking room and encouraged CSW to contact [facility] [employer] to see if they will be able to transport pt due to hotel being in close proximity to the hospital.  CSW contacted safe transport who reported that their vendor will not transport anyone who has tested positive for COVID.  CSW contacted Becca back and informed her pt would need hotel for 7 days, smoking room, and does not have any transportation available. Selena Batten reported that she would be able to provide transportation for pt at discharge to the hotel and get pt checked in. Selena Batten reported she would be able to get pts prescriptions filled so they will have their medications in the hotel with them and that she will provide them with some groceries and hygiene products to last for the week at the hotel. Selena Batten reported she should be able to get pt between 12 and 1pm and would call CSW back after she gets the hotel room booked.  CSW notified Dr. Weber Cooks and AD Warner Mccreedy of discharge plan.   Evalina Field, MSW, LCSW Clinical Social Work 08/08/2019 9:55 AM

## 2019-08-08 NOTE — Plan of Care (Signed)
Pt rates depression 4/1O and anxiety 5/10, Pt denies SI, HI and AVH. Pt was educated on care plan and verbalizes understanding. Collier Bullock RN Problem: Education: Goal: Knowledge of [place] [facility] Education information/materials will improve Outcome: Adequate for Discharge Goal: Emotional status will improve Outcome: Adequate for Discharge Goal: Mental status will improve Outcome: Adequate for Discharge Goal: Verbalization of understanding the information provided will improve Outcome: Adequate for Discharge   Problem: Safety: Goal: Periods of time without injury will increase Outcome: Adequate for Discharge   Problem: Education: Goal: Ability to make informed decisions regarding treatment will improve Outcome: Adequate for Discharge   Problem: Self-Concept: Goal: Ability to disclose and discuss suicidal ideas will improve Outcome: Adequate for Discharge Goal: Will verbalize positive feelings about self Outcome: Adequate for Discharge

## 2019-08-08 NOTE — [facility] Suicide Risk Assessment (Signed)
[facility] Discharge Suicide Risk Assessment   Principal Problem: Severe bipolar I disorder, current or most recent episode depressed ([place]) Discharge Diagnoses: Principal Problem:   Severe bipolar I disorder, current or most recent episode depressed ([place])   Total Time spent with patient: 30 minutes  Musculoskeletal: Strength & Muscle Tone: within normal limits Gait & Station: normal Patient leans: N/A  Psychiatric Specialty Exam: Review of Systems  Constitutional: Negative.   HENT: Negative.   Eyes: Negative.   Respiratory: Negative.   Cardiovascular: Negative.   Gastrointestinal: Negative.   Musculoskeletal: Negative.   Skin: Negative.   Neurological: Negative.   Psychiatric/Behavioral: Negative.     Blood pressure 135/80, pulse 94, temperature 98.4 F (36.9 C), temperature source Oral, resp. rate 18, height 6\' 1"  (1.854 m), weight 98 kg, SpO2 100 %.Body mass index is 28.5 kg/m.  General Appearance: Casual  Eye Contact::  Good  Speech:  Clear and Coherent409  Volume:  Normal  Mood:  Euthymic  Affect:  Constricted  Thought Process:  Coherent  Orientation:  Full (Time, Place, and Person)  Thought Content:  Logical  Suicidal Thoughts:  No  Homicidal Thoughts:  No  Memory:  Immediate;   Fair Recent;   Fair Remote;   Fair  Judgement:  Fair  Insight:  Fair  Psychomotor Activity:  Normal  Concentration:  Fair  Recall:  [employer] of Knowledge:Fair  Language: Fair  Akathisia:  No  Handed:  Right  AIMS (if indicated):     Assets:  Desire for Improvement  Sleep:  Number of Hours: 7.25  Cognition: WNL  ADL's:  Intact   Mental Status Per Nursing Assessment::   On Admission:  Suicidal ideation indicated by patient  Demographic Factors:  Male, Caucasian and Low socioeconomic status  Loss Factors: Financial problems/change in socioeconomic status  Historical Factors: Impulsivity  Risk Reduction Factors:   Religious beliefs about death, Living with another person,  especially a relative and Positive social support  Continued Clinical Symptoms:  Bipolar Disorder:   Mixed State  Cognitive Features That Contribute To Risk:  None    Suicide Risk:  Minimal: No identifiable suicidal ideation.  Patients presenting with no risk factors but with morbid ruminations; may be classified as minimal risk based on the severity of the depressive symptoms  Follow-up Information    [facility] Follow up.   Why: Walk in hours are Monday, Wednesday, and Friday from 8AM-4PM. Contact information: [place] [phone]           Plan Of Care/Follow-up recommendations:  Activity:  Activity as tolerated Diet:  Regular diet Other:  Follow-up outpatient treatment as indicated at [place], MD 08/08/2019, 10:27 AM

## 2019-08-08 NOTE — Progress Notes (Signed)
Patient remains 1:1 for safety. Patient is asleep and without complaint at this time
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
## 2019-08-08 NOTE — Progress Notes (Signed)
When patient was awake he refused his night medications. Patient was irritable at the time. Patient has since gone to sleep. Patient remains 1:1 for safety

## 2019-08-08 NOTE — Progress Notes (Signed)
[facility] MD Progress Note  08/08/2019 9:08 AM Brendan Reyes  MRN:  [id number] Subjective: Patient seen chart reviewed.  Follow-up note for this 53 year old man with a history of bipolar disorder and what appears to be some degree of intellectual disability.  Patient had psychiatrically continued to display symptoms of agitation and extreme mood lability aggression and including threats of violence and threats of suicide with extremely low frustration tolerance all up through yesterday.  Yesterday afternoon he escalated to the point where it seemed like restraint might be necessary although he then de-escalate it and allowed for medication when a show of force was presented.  Patient also tested positive for COVID-19.  Today he is denying any specific symptoms of COVID-19.  Does not feel feverish.  He is not coughing and does not feel short of breath and does not feel as though he is having flulike symptoms.  Today when I went to see him he was in bed but awake.  He was actually appropriate and polite.  He told me that he had spoken with his girlfriend and that she had said that there was a trailer on her property in which he could stay although he said she still was very concerned about his status with coronavirus and would prefer that he go to the COVID-19 hotel first.  Our staff have been trying to contact the COVID-19 hotel and as of this morning do not have an answer from them.  Patient is now on Seroquel Depakote and lithium and has begun to be more consistently compliant with medicine.  He denies suicidal or homicidal thoughts this morning.  He denies hallucinations.  As I said above he was polite with me and told me that if we could help him to find a place to go he would promise to be on his best behavior.  No other physical complaints.  Vital signs most recently unremarkable.  Lab test most recently the troponins that were drawn were low normal. Principal Problem: Severe bipolar I disorder, current or most  recent episode depressed (HCC) Diagnosis: Principal Problem:   Severe bipolar I disorder, current or most recent episode depressed (HCC)  Total Time spent with patient: 30 minutes  Past Psychiatric History: Patient has a history of mood instability diagnosis of bipolar disorder behavior problems that have escalated to the point of law enforcement involvement.  Some of our history is limited because the patient has received treatment in other states and is not an extraordinarily good historian.  Past Medical History: History reviewed. No pertinent past medical history. History reviewed. No pertinent surgical history. Family History: History reviewed. No pertinent family history. Family Psychiatric  History: Nothing reported Social History:  Social History   Substance and Sexual Activity  Alcohol Use None     Social History   Substance and Sexual Activity  Drug Use Not on file    Social History   Socioeconomic History  . Marital status: Single    Spouse name: Not on file  . Number of children: Not on file  . Years of education: Not on file  . Highest education level: Not on file  Occupational History  . Not on file  Tobacco Use  . Smoking status: Current Every Day Smoker    Packs/day: 1.00    Types: Cigarettes  . Smokeless tobacco: Never Used  Substance and Sexual Activity  . Alcohol use: Not on file  . Drug use: Not on file  . Sexual activity: Not on file  Other  Topics Concern  . Not on file  Social History Narrative  . Not on file   Social Determinants of Health   Financial Resource Strain:   . Difficulty of Paying Living Expenses: Not on file  Food Insecurity:   . Worried About Charity fundraiser in the Last Year: Not on file  . Ran Out of Food in the Last Year: Not on file  Transportation Needs:   . Lack of Transportation (Medical): Not on file  . Lack of Transportation (Non-Medical): Not on file  Physical Activity:   . Days of Exercise per Week: Not on  file  . Minutes of Exercise per Session: Not on file  Stress:   . Feeling of Stress : Not on file  Social Connections:   . Frequency of Communication with Friends and Family: Not on file  . Frequency of Social Gatherings with Friends and Family: Not on file  . Attends Religious Services: Not on file  . Active Member of Clubs or Organizations: Not on file  . Attends Archivist Meetings: Not on file  . Marital Status: Not on file   Additional Social History:                         Sleep: Fair  Appetite:  Fair  Current Medications: Current Facility-Administered Medications  Medication Dose Route Frequency Provider Last Rate Last Admin  . acetaminophen (TYLENOL) tablet 650 mg  650 mg Oral Q6H PRN Cristofano, Dorene Ar, MD      . alum & mag hydroxide-simeth (MAALOX/MYLANTA) 200-200-20 MG/5ML suspension 30 mL  30 mL Oral Q4H PRN Cristofano, Dorene Ar, MD      . cholecalciferol (VITAMIN D3) tablet 1,000 Units  1,000 Units Oral Daily Sharma Covert, MD   1,000 Units at 08/08/19 [phone]  . diphenhydrAMINE (BENADRYL) capsule 50 mg  50 mg Oral Q6H PRN Money, Darnelle Maffucci B, FNP       Or  . diphenhydrAMINE (BENADRYL) injection 50 mg  50 mg Intramuscular Q6H PRN Money, Darnelle Maffucci B, FNP   50 mg at 08/07/19 1645  . divalproex (DEPAKOTE) DR tablet 500 mg  500 mg Oral Daily Sharma Covert, MD   500 mg at 08/08/19 0820  . divalproex (DEPAKOTE) DR tablet 750 mg  750 mg Oral QHS Sharma Covert, MD   750 mg at 08/06/19 2054  . doxepin (SINEQUAN) capsule 25 mg  25 mg Oral QHS PRN Cristofano, Paul A, MD      . haloperidol (HALDOL) tablet 10 mg  10 mg Oral Q8H PRN Sharma Covert, MD       Or  . haloperidol lactate (HALDOL) injection 10 mg  10 mg Intramuscular Q8H PRN Sharma Covert, MD   10 mg at 08/07/19 1645  . hydrOXYzine (ATARAX/VISTARIL) tablet 50 mg  50 mg Oral TID PRN Money, Lowry Ram, FNP   50 mg at 08/06/19 2231  . lithium carbonate (ESKALITH) CR tablet 450 mg  450 mg Oral  Q12H Sharma Covert, MD   450 mg at 08/08/19 0820  . LORazepam (ATIVAN) tablet 2 mg  2 mg Oral Q4H PRN Winter Jocelyn, Madie Reno, MD   2 mg at 08/06/19 2335   Or  . LORazepam (ATIVAN) injection 2 mg  2 mg Intramuscular Q4H PRN Keidan Aumiller, Madie Reno, MD   2 mg at 08/07/19 1645  . magnesium hydroxide (MILK OF MAGNESIA) suspension 30 mL  30 mL Oral Daily PRN Cristofano,  Worthy Rancher, MD      . nicotine (NICODERM CQ - dosed in mg/24 hours) patch 21 mg  21 mg Transdermal Daily Cristofano, Worthy Rancher, MD   21 mg at 08/08/19 0824  . OLANZapine zydis (ZYPREXA) disintegrating tablet 10 mg  10 mg Oral Q8H PRN Antonieta Pert, MD       And  . ziprasidone (GEODON) injection 20 mg  20 mg Intramuscular PRN Antonieta Pert, MD      . QUEtiapine (SEROQUEL) tablet 50 mg  50 mg Oral BID Money, Gerlene Burdock, FNP   50 mg at 08/08/19 7591  . QUEtiapine (SEROQUEL) tablet 600 mg  600 mg Oral QHS Antonieta Pert, MD   600 mg at 08/06/19 2054  . vitamin C (ASCORBIC ACID) tablet 500 mg  500 mg Oral Daily Antonieta Pert, MD   500 mg at 08/08/19 6384  . zinc sulfate capsule 220 mg  220 mg Oral Daily Antonieta Pert, MD   220 mg at 08/08/19 0820  . ziprasidone (GEODON) injection 20 mg  20 mg Intramuscular Q6H PRN Elias Dennington, Jackquline Denmark, MD   20 mg at 08/07/19 1308    Lab Results:  Results for orders placed or performed during the hospital encounter of 08/01/19 (from the past 48 hour(s))  Fibrinogen     Status: None   Collection Time: 08/06/19  2:18 PM  Result Value Ref Range   Fibrinogen 337 210 - 475 mg/dL    Comment: Performed at [facility], [address] [place], [place] [address]  Fibrin derivatives D-[facility] only)     Status: None   Collection Time: 08/06/19  2:18 PM  Result Value Ref Range   Fibrin derivatives D-dimer ([facility]) 325.12 0.00 - 499.00 ng/mL (FEU)    Comment: (NOTE) <> Exclusion of Venous Thromboembolism (VTE) - OUTPATIENT ONLY   (Emergency Department or [place])   0-499 ng/ml (FEU): With a low to  intermediate pretest probability                      for VTE this test result excludes the diagnosis                      of VTE.   >499 ng/ml (FEU) : VTE not excluded; additional work up for VTE is                      required. <> Testing on Inpatients and Evaluation of Disseminated Intravascular   Coagulation (DIC) Reference Range:   0-499 ng/ml (FEU) Performed at [facility], [address] [place], [place] [address]   Protime-INR     Status: None   Collection Time: 08/06/19  3:15 PM  Result Value Ref Range   Prothrombin Time 12.6 11.4 - 15.2 seconds   INR 1.0 0.8 - 1.2    Comment: (NOTE) INR goal varies based on device and disease states. Performed at [facility], [address] [place], [place] [address]   CBC with Differential/Platelet     Status: None   Collection Time: 08/06/19  3:15 PM  Result Value Ref Range   WBC 6.2 4.0 - 10.5 K/uL   RBC 5.41 4.22 - 5.81 MIL/uL   Hemoglobin 16.8 13.0 - 17.0 g/dL   HCT 03.0 09.2 - 33.0 %   MCV 91.5 80.0 - 100.0 fL   MCH 31.1 26.0 - 34.0 pg   MCHC 33.9 30.0 -  36.0 g/dL   RDW 16.1 09.6 - 04.5 %   Platelets 293 150 - 400 K/uL   nRBC 0.0 0.0 - 0.2 %   Neutrophils Relative % 52 %   Neutro Abs 3.3 1.7 - 7.7 K/uL   Lymphocytes Relative 35 %   Lymphs Abs 2.2 0.7 - 4.0 K/uL   Monocytes Relative 9 %   Monocytes Absolute 0.6 0.1 - 1.0 K/uL   Eosinophils Relative 3 %   Eosinophils Absolute 0.2 0.0 - 0.5 K/uL   Basophils Relative 1 %   Basophils Absolute 0.0 0.0 - 0.1 K/uL   Immature Granulocytes 0 %   Abs Immature Granulocytes 0.01 0.00 - 0.07 K/uL    Comment: Performed at [facility], [address], [place], [place] [address]  Troponin I (High Sensitivity)     Status: None   Collection Time: 08/06/19  8:08 PM  Result Value Ref Range   Troponin I (High Sensitivity) 3 <18 ng/L    Comment: (NOTE) Elevated high sensitivity troponin I (hsTnI) values and significant  changes across serial  measurements may suggest ACS but many other  chronic and acute conditions are known to elevate hsTnI results.  Refer to the "Links" section for chest pain algorithms and additional  guidance. Performed at [facility], [address] [place], [place] [address]   HIV Antibody (routine testing w rflx)     Status: None   Collection Time: 08/06/19  8:33 PM  Result Value Ref Range   HIV Screen 4th Generation wRfx NON REACTIVE NON REACTIVE    Comment: Performed at [facility], [address] [place], [place] [address]  ABO/Rh     Status: None   Collection Time: 08/06/19  8:33 PM  Result Value Ref Range   ABO/RH(D)      A POS Performed at [facility], [address] [place], [place] [address]   C-reactive protein     Status: None   Collection Time: 08/06/19  8:33 PM  Result Value Ref Range   CRP 0.6 <1.0 mg/dL    Comment: Performed at [facility], [address], [place], [place] [address]  Procalcitonin - Baseline     Status: None   Collection Time: 08/06/19  8:33 PM  Result Value Ref Range   Procalcitonin <0.10 ng/mL    Comment:        Interpretation: PCT (Procalcitonin) <= 0.5 ng/mL: Systemic infection (sepsis) is not likely. Local bacterial infection is possible. (NOTE)       Sepsis PCT Algorithm           Lower Respiratory Tract                                      Infection PCT Algorithm    ----------------------------     ----------------------------         PCT < 0.25 ng/mL                PCT < 0.10 ng/mL         Strongly encourage             Strongly discourage   discontinuation of antibiotics    initiation of antibiotics    ----------------------------     -----------------------------       PCT 0.25 - 0.50 ng/mL            PCT  0.10 - 0.25 ng/mL               OR       >80% decrease in PCT            Discourage initiation of                                            antibiotics      Encourage discontinuation           of  antibiotics    ----------------------------     -----------------------------         PCT >= 0.50 ng/mL              PCT 0.26 - 0.50 ng/mL               AND        <80% decrease in PCT             Encourage initiation of                                             antibiotics       Encourage continuation           of antibiotics    ----------------------------     -----------------------------        PCT >= 0.50 ng/mL                  PCT > 0.50 ng/mL               AND         increase in PCT                  Strongly encourage                                      initiation of antibiotics    Strongly encourage escalation           of antibiotics                                     -----------------------------                                           PCT <= 0.25 ng/mL                                                 OR                                        > 80% decrease in PCT  Discontinue / Do not initiate                                             antibiotics Performed at [facility], [address] [place], [place] [address]   Basic metabolic panel     Status: None   Collection Time: 08/06/19  8:34 PM  Result Value Ref Range   Sodium 141 135 - 145 mmol/L   Potassium 3.7 3.5 - 5.1 mmol/L   Chloride 105 98 - 111 mmol/L   CO2 27 22 - 32 mmol/L   Glucose, Bld 90 70 - 99 mg/dL   BUN 18 6 - 20 mg/dL   Creatinine, Ser 6.57 0.61 - 1.24 mg/dL   Calcium 9.0 8.9 - 84.6 mg/dL   GFR calc non Af Amer >60 >60 mL/min   GFR calc Af Amer >60 >60 mL/min   Anion gap 9 5 - 15    Comment: Performed at [facility], [address], [place], [place] [address]  Troponin I (High Sensitivity)     Status: None   Collection Time: 08/06/19 10:07 PM  Result Value Ref Range   Troponin I (High Sensitivity) 3 <18 ng/L    Comment: (NOTE) Elevated high sensitivity troponin I (hsTnI) values and significant  changes across serial measurements may  suggest ACS but many other  chronic and acute conditions are known to elevate hsTnI results.  Refer to the "Links" section for chest pain algorithms and additional  guidance. Performed at [facility], [address] [place], [place] [address]     Blood Alcohol level:  No results found for: [facility]  Metabolic Disorder Labs: No results found for: HGBA1C, MPG No results found for: PROLACTIN No results found for: CHOL, TRIG, HDL, CHOLHDL, VLDL, LDLCALC  Physical Findings: AIMS:  , ,  ,  ,    CIWA:    COWS:     Musculoskeletal: Strength & Muscle Tone: within normal limits Gait & Station: normal Patient leans: N/A  Psychiatric Specialty Exam: Physical Exam  Nursing note and vitals reviewed. Constitutional: He appears well-developed and well-nourished.  HENT:  Head: Normocephalic and atraumatic.  Eyes: Pupils are equal, round, and reactive to light. Conjunctivae are normal.  Cardiovascular: Regular rhythm and normal heart sounds.  Respiratory: Effort normal. No respiratory distress.  GI: Soft.  Musculoskeletal:        General: Normal range of motion.     Cervical back: Normal range of motion.  Neurological: He is alert.  Skin: Skin is warm and dry.  Psychiatric: His speech is normal. His mood appears anxious. He is not agitated and not aggressive. Thought content is not paranoid. Cognition and memory are impaired. He expresses impulsivity. He expresses no homicidal and no suicidal ideation.    Review of Systems  Constitutional: Negative.   HENT: Negative.   Eyes: Negative.   Respiratory: Negative.   Cardiovascular: Negative.   Gastrointestinal: Negative.   Musculoskeletal: Negative.   Skin: Negative.   Neurological: Negative.   Psychiatric/Behavioral: Positive for agitation, behavioral problems and dysphoric mood.    Blood pressure 135/80, pulse 94, temperature 98.4 F (36.9 C), temperature source Oral, resp. rate 18, height  (1.854 m), weight 98 kg,  SpO2 100 %.Body mass index is 28.5 kg/m.  General Appearance: Disheveled  Eye Contact:  Fair  Speech:  Clear and Coherent  Volume:  Normal  Mood:  Euthymic  Affect:  Congruent  Thought Process:  Coherent  Orientation:  Full (Time, Place, and Person)  Thought Content:  Logical  Suicidal Thoughts:  No  Homicidal Thoughts:  No  Memory:  Immediate;   Fair Recent;   Fair Remote;   Fair  Judgement:  Impaired  Insight:  Shallow  Psychomotor Activity:  Normal  Concentration:  Concentration: Fair  Recall:  [employer] of Knowledge:  Fair  Language:  Fair  Akathisia:  No  Handed:  Right  AIMS (if indicated):     Assets:  Desire for Improvement Resilience  ADL's:  Impaired  Cognition:  Impaired,  Mild  Sleep:  Number of Hours: 7.25     Treatment Plan Summary: Daily contact with patient to assess and evaluate symptoms and progress in treatment, Medication management and Plan I will not presume that his current improvement is a lasting change but at least for now he is showing enough improvement in his mental state to make it easier to plan with him.  Treatment team is searching for a possible disposition to the provided coronavirus "hotel" facilities.  Currently the patient is asymptomatic medically but did have a recent positive COVID-19 test.  I am going to put in an order to check his lithium level tomorrow morning if he is still here.  Last time it was checked it was undetectable but he has been more compliant since then.  He is on high doses of Seroquel but does not currently appear to be oversedated or delirious.  No change to medicine for today.  A medicine consult was called the night before last apparently because the patient had complained of chest pain.  I communicated with the hospitalist yesterday about this.  It was the hospitalist who had placed the order for a cardiologist.  Cardiologist actually did come by yesterday in the middle of the patient's episode of near violence and  so did not see the patient.  Given that he has no cardiac complaints today, that his vitals are normal, his troponin was completely normal I think that we can safely discontinue the medicine and cardiology consults for now and set that issue aside as managed for the moment.  Mordecai Rasmussen, MD 08/08/2019, 9:08 AM

## 2019-08-08 NOTE — Progress Notes (Signed)
CSW received a call back from [place] with [facility] who reported that they are currently full and are only accepting [employer] at this time. Debbie reported that pt would have to have a shelter placement prior to being accepted into the hotel program, and all their shelters are currently full.  Evalina Field, MSW, LCSW Clinical Social Work 08/08/2019 9:09 AM

## 2019-08-08 NOTE — Progress Notes (Signed)
I called the patient's significant other to warn her that the patient had been discharged. I called her at least five times. It went straight to voicemail. I never received a phone call from her.  Collier Bullock RN

## 2019-08-08 NOTE — Plan of Care (Signed)
  Problem: Group Participation Goal: STG - Patient will engage in groups without prompting or encouragement from LRT x3 group sessions within 5 recreation therapy group sessions Description: STG - Patient will engage in groups without prompting or encouragement from LRT x3 group sessions within 5 recreation therapy group sessions 08/08/2019 1101 by Ernest Haber, LRT Outcome: Not Applicable [id number] by Ernest Haber, LRT Outcome: Not Met (add Reason) Note: Patient did not attend any groups.

## 2019-08-08 NOTE — BHH Suicide Risk Assessment (Signed)
[facility] Discharge Suicide Risk Assessment   Principal Problem: Severe bipolar I disorder, current or most recent episode depressed ([place]) Discharge Diagnoses: Principal Problem:   Severe bipolar I disorder, current or most recent episode depressed ([place])   Total Time spent with patient: 30 minutes  Musculoskeletal: Strength & Muscle Tone: within normal limits Gait & Station: normal Patient leans: Right  Psychiatric Specialty Exam: Review of Systems  Constitutional: Negative.   HENT: Negative.   Eyes: Negative.   Respiratory: Negative.   Cardiovascular: Negative.   Gastrointestinal: Negative.   Musculoskeletal: Negative.   Skin: Negative.   Neurological: Negative.   Psychiatric/Behavioral: Negative.     Blood pressure 135/80, pulse 94, temperature 98.4 F (36.9 C), temperature source Oral, resp. rate 18, height 6\' 1"  (1.854 m), weight 98 kg, SpO2 100 %.Body mass index is 28.5 kg/m.  General Appearance: Disheveled  Eye Sport and exercise psychologist::  Fair  Speech:  Normal Rate409  Volume:  Normal  Mood:  Euthymic  Affect:  Congruent  Thought Process:  Coherent  Orientation:  Full (Time, Place, and Person)  Thought Content:  Logical  Suicidal Thoughts:  No  Homicidal Thoughts:  No  Memory:  Immediate;   Fair Recent;   Fair Remote;   Fair  Judgement:  Fair  Insight:  Fair  Psychomotor Activity:  Decreased  Concentration:  Fair  Recall:  [employer] of Knowledge:Fair  Language: Fair  Akathisia:  No  Handed:  Right  AIMS (if indicated):     Assets:  Desire for Improvement Physical Health Social Support  Sleep:  Number of Hours: 7.25  Cognition: Impaired,  Mild  ADL's:  Intact   Mental Status Per Nursing Assessment::   On Admission:  Suicidal ideation indicated by patient  Demographic Factors:  Male, Caucasian and Unemployed  Loss Factors: Legal issues  Historical Factors: Impulsivity  Risk Reduction Factors:   Religious beliefs about death and Positive social  support  Continued Clinical Symptoms:  Bipolar Disorder:   Mixed State  Cognitive Features That Contribute To Risk:  Thought constriction (tunnel vision)    Suicide Risk:  Minimal: No identifiable suicidal ideation.  Patients presenting with no risk factors but with morbid ruminations; may be classified as minimal risk based on the severity of the depressive symptoms    Plan Of Care/Follow-up recommendations:  Activity:  Activity as tolerated Diet:  Regular diet Other:  Follow-up with outpatient treatment as indicated while continuing on current medicine.  Alethia Berthold, MD 08/08/2019, 10:05 AM

## 2019-08-08 NOTE — Progress Notes (Signed)
  [facility] Adult Case Management Discharge Plan :  Will you be returning to the same living situation after discharge:  No. At discharge, do you have transportation home?: Yes,  Public Health is providing transportation Do you have the ability to pay for your medications: Yes,  [employer]  Release of information consent forms completed and in the chart;   Patient to Follow up at: Follow-up Information    [facility] Follow up.   Why: Walk in hours are Monday, Wednesday, and Friday from 8AM-4PM. Contact information: [address] [facility] [address] [phone]           Next level of care provider has access to [place] and Suicide Prevention discussed: Yes,  SPE completed with pt as pt declined collateral contact  Have you used any form of tobacco in the last 30 days? (Cigarettes, Smokeless Tobacco, Cigars, and/or Pipes): Yes  Has patient been referred to the Quitline?: Patient refused referral  Patient has been referred for addiction treatment: Pt. refused referral  Delfin Edis, LCSW 08/08/2019, 10:21 AM

## 2019-08-08 NOTE — Progress Notes (Signed)
Recreation Therapy Notes  INPATIENT RECREATION TR PLAN  Patient Details Name: Arlen Lapier MRN: [id number] DOB: 11/07/1965 Today's Date: 08/08/2019  Rec Therapy Plan Is patient appropriate for Therapeutic Recreation?: Yes Treatment times per week: at least 3 Estimated Length of Stay: 5-7 days TR Treatment/Interventions: Group participation (Comment)  Discharge Criteria Pt will be discharged from therapy if:: Discharged Treatment plan/goals/alternatives discussed and agreed upon by:: Patient/family  Discharge Summary Short term goals set: Patient will engage in groups without prompting or encouragement from LRT x3 group sessions within 5 recreation therapy group sessions Short term goals met: Not met Progress toward goals comments: Groups attended Reason goals not met: Patient did not attend any groups Therapeutic equipment acquired: N/A Reason patient discharged from therapy: Discharge from hospital Pt/family agrees with progress & goals achieved: Yes Date patient discharged from therapy: 08/08/19      08/08/2019, 11:00 AM  

## 2019-08-08 NOTE — Progress Notes (Signed)
Recreation Therapy Notes  Date: 08/08/2019  Time: 9:30 am    Recreation Therapy group canceled due to COVID-19.     Raeqwon Lux LRT/CTRS        Meeah Totino 08/08/2019 10:56 AM

## 2021-03-09 IMAGING — DX DG CHEST 1V
2 series · 2 of 2 positions shown · non-contrast
Comparison: None.

CLINICAL DATA: Chest pain

EXAM:
CHEST  1 VIEW

[chest ap (1 of 2)]
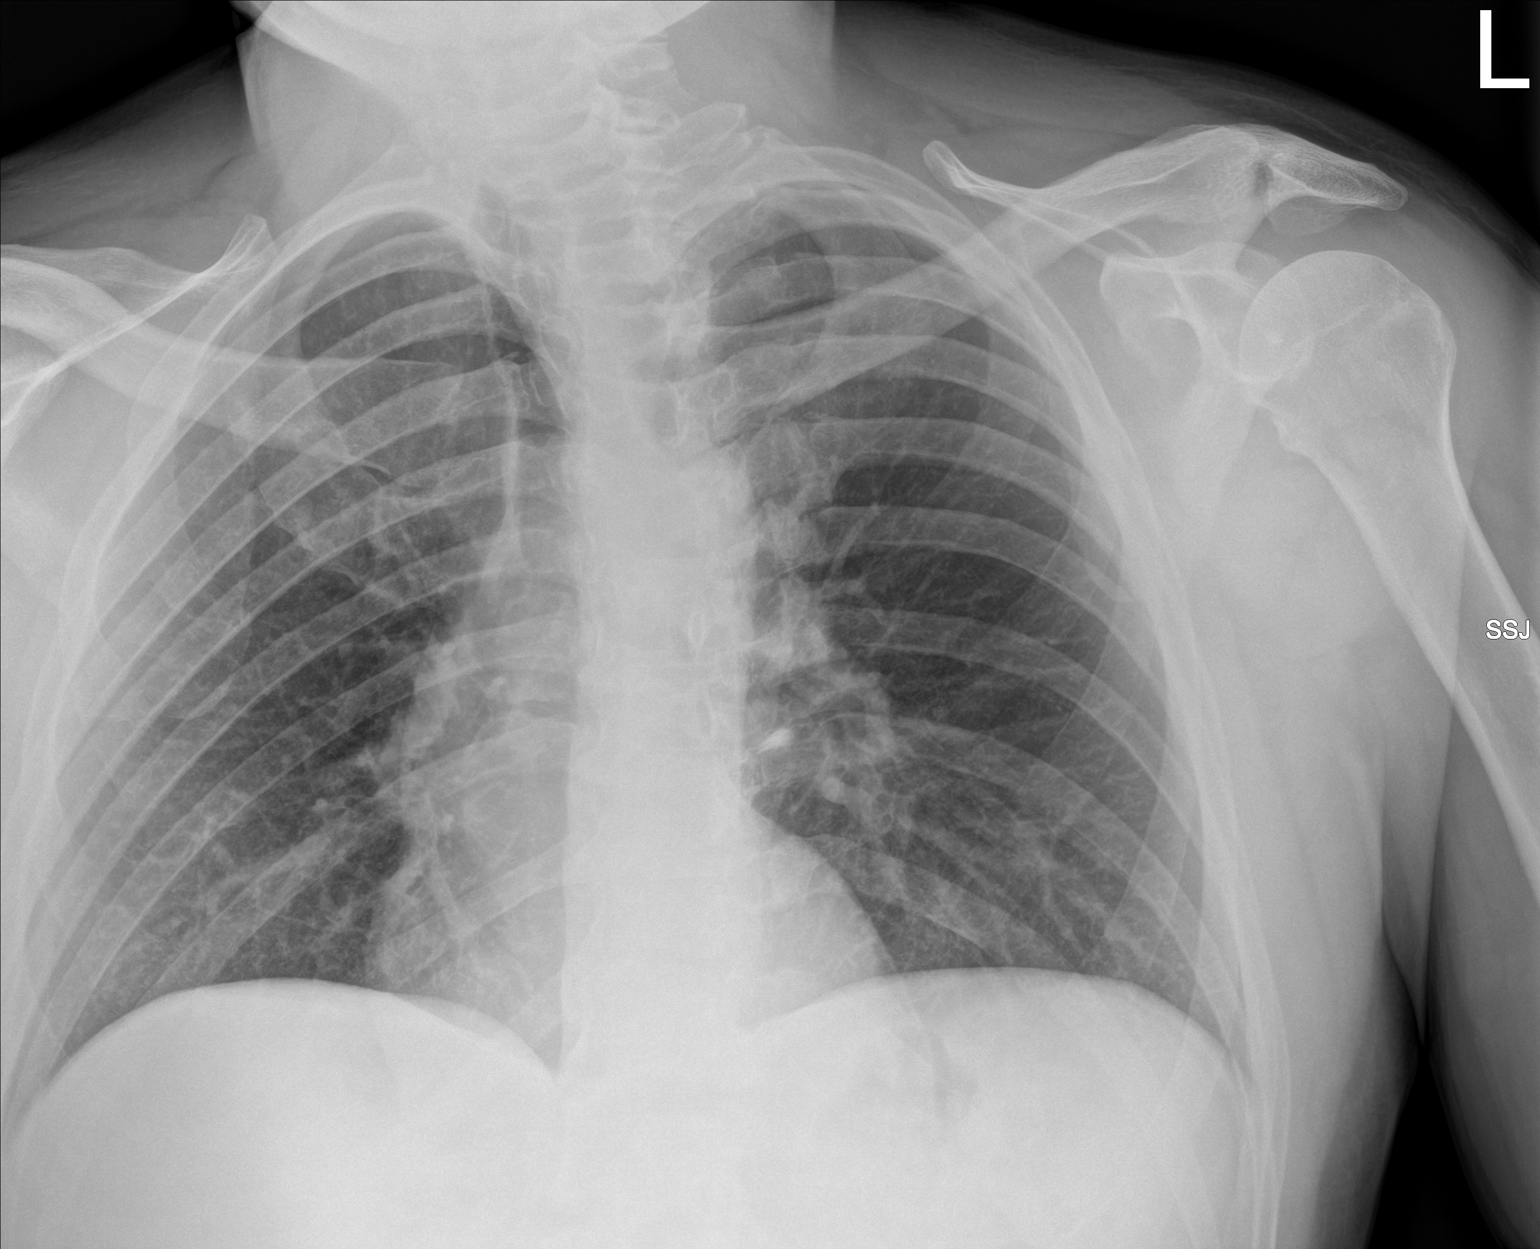

[chest ap (2 of 2)]
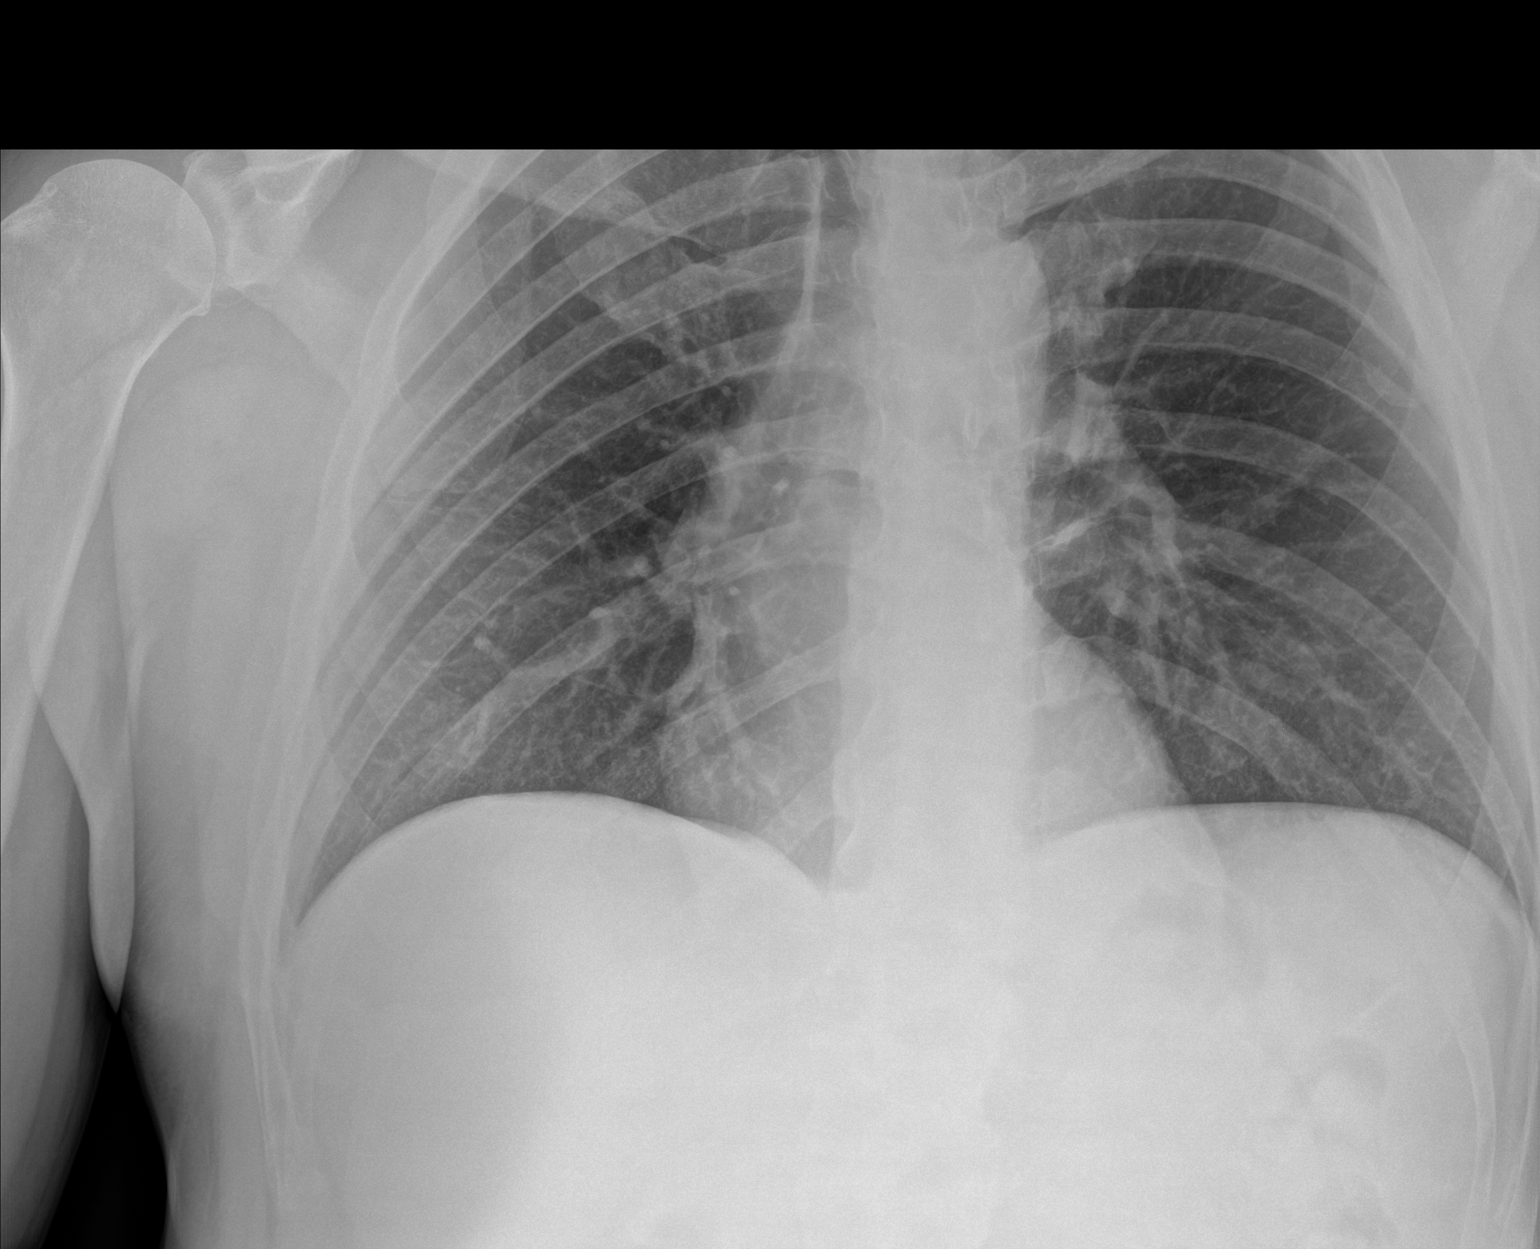

[2 of 2 positions shown; findings below may reference images not displayed]

FINDINGS: The heart size and mediastinal contours are within normal limits.
Both lungs are clear. The visualized skeletal structures are
unremarkable.
IMPRESSION: No active disease.
